# Patient Record
Sex: Male | Born: 1975 | Marital: Married | Smoking: Never smoker
Health system: Southern US, Community
[De-identification: ages and names within clinical notes are randomized; demographics above are authoritative.]

## PROBLEM LIST (undated history)

## (undated) DIAGNOSIS — E7800 Pure hypercholesterolemia, unspecified: Secondary | ICD-10-CM

## (undated) DIAGNOSIS — G4730 Sleep apnea, unspecified: Secondary | ICD-10-CM

## (undated) DIAGNOSIS — J302 Other seasonal allergic rhinitis: Secondary | ICD-10-CM

## (undated) DIAGNOSIS — I10 Essential (primary) hypertension: Secondary | ICD-10-CM

## (undated) DIAGNOSIS — M62838 Other muscle spasm: Secondary | ICD-10-CM

## (undated) HISTORY — PX: OTHER SURGICAL HISTORY: SHX169

## (undated) HISTORY — DX: Other muscle spasm: M62.838

## (undated) HISTORY — DX: Other seasonal allergic rhinitis: J30.2

## (undated) HISTORY — DX: Pure hypercholesterolemia, unspecified: E78.00

## (undated) HISTORY — DX: Sleep apnea, unspecified: G47.30

## (undated) HISTORY — DX: Essential (primary) hypertension: I10

---

## 2018-09-23 DIAGNOSIS — E7800 Pure hypercholesterolemia, unspecified: Secondary | ICD-10-CM | POA: Diagnosis not present

## 2018-09-23 DIAGNOSIS — E291 Testicular hypofunction: Secondary | ICD-10-CM | POA: Diagnosis not present

## 2018-09-23 DIAGNOSIS — I10 Essential (primary) hypertension: Secondary | ICD-10-CM | POA: Diagnosis not present

## 2018-09-28 DIAGNOSIS — G4730 Sleep apnea, unspecified: Secondary | ICD-10-CM | POA: Diagnosis not present

## 2018-09-28 DIAGNOSIS — I10 Essential (primary) hypertension: Secondary | ICD-10-CM | POA: Diagnosis not present

## 2018-09-28 DIAGNOSIS — E7800 Pure hypercholesterolemia, unspecified: Secondary | ICD-10-CM | POA: Diagnosis not present

## 2018-09-28 DIAGNOSIS — Z0000 Encounter for general adult medical examination without abnormal findings: Secondary | ICD-10-CM | POA: Diagnosis not present

## 2019-02-16 ENCOUNTER — Emergency Department (HOSPITAL_COMMUNITY)
Admission: EM | Admit: 2019-02-16 | Discharge: 2019-02-16 | Disposition: A | Discharge status: Home / Self Care | Payer: 59 | Attending: Emergency Medicine | Admitting: Emergency Medicine

## 2019-02-16 ENCOUNTER — Encounter: Payer: Self-pay | Admitting: Family Medicine

## 2019-02-16 ENCOUNTER — Other Ambulatory Visit: Payer: Self-pay

## 2019-02-16 ENCOUNTER — Ambulatory Visit: Payer: 59 | Admitting: Family Medicine

## 2019-02-16 ENCOUNTER — Encounter (HOSPITAL_COMMUNITY): Payer: Self-pay | Admitting: Emergency Medicine

## 2019-02-16 VITALS — Wt 280.0 lb

## 2019-02-16 DIAGNOSIS — Z0100 Encounter for examination of eyes and vision without abnormal findings: Secondary | ICD-10-CM | POA: Diagnosis not present

## 2019-02-16 DIAGNOSIS — Y929 Unspecified place or not applicable: Secondary | ICD-10-CM | POA: Diagnosis not present

## 2019-02-16 DIAGNOSIS — Y999 Unspecified external cause status: Secondary | ICD-10-CM | POA: Insufficient documentation

## 2019-02-16 DIAGNOSIS — W274XXA Contact with kitchen utensil, initial encounter: Secondary | ICD-10-CM | POA: Diagnosis not present

## 2019-02-16 DIAGNOSIS — Y93G1 Activity, food preparation and clean up: Secondary | ICD-10-CM | POA: Diagnosis not present

## 2019-02-16 DIAGNOSIS — S61012A Laceration without foreign body of left thumb without damage to nail, initial encounter: Secondary | ICD-10-CM | POA: Diagnosis not present

## 2019-02-16 MED ORDER — BUPIVACAINE HCL (PF) 0.5 % IJ SOLN
10.0000 mL | Freq: Once | INTRAMUSCULAR | Status: AC
  Administered 2019-02-16: 10 mL
  Filled 2019-02-16: qty 30

## 2019-02-16 NOTE — ED Notes (Signed)
An After Visit Summary was printed and given to the patient. Discharge instructions given and no further questions at this time.  

## 2019-02-16 NOTE — ED Notes (Signed)
Bupivaine at bedside

## 2019-02-16 NOTE — Patient Instructions (Signed)
Health Maintenance Due  Topic Date Due  . HIV Screening  11/30/1992  . TETANUS/TDAP  11/30/1996  . INFLUENZA VACCINE  11/07/2018   No flowsheet data found.

## 2019-02-16 NOTE — ED Provider Notes (Signed)
[facility] DEPT Provider Note   CSN: [id number] Arrival date & time: 02/16/19  1750     History   Chief Complaint Chief Complaint  Patient presents with  . Laceration    HPI Shawn Mckenzie is a 43 y.o. male.     HPI   Shawn Mckenzie is a 43 y.o. male, patient with no pertinent past medical history, presenting to the ED with laceration to left thumb that occurred shortly prior to arrival.  Patient was using a mandolin slicer.  Tetanus up-to-date. Denies numbness, weakness, other injuries.  History reviewed. No pertinent past medical history.  There are no active problems to display for this patient.   History reviewed. No pertinent surgical history.      Home Medications    Prior to Admission medications   Not on File    Family History No family history on file.  Social History Social History   Tobacco Use  . Smoking status: Never Smoker  . Smokeless tobacco: Never Used  Substance Use Topics  . Alcohol use: Not on file  . Drug use: Not on file     Allergies   Penicillins   Review of Systems Review of Systems  Skin: Positive for wound.  Neurological: Negative for weakness and numbness.     Physical Exam Updated Vital Signs BP 135/61 (BP Location: Right Arm)   Pulse 81   Temp 98.5 F (36.9 C) (Oral)   Resp 16   SpO2 99%   Physical Exam Vitals signs and nursing note reviewed.  Constitutional:      General: He is not in acute distress.    Appearance: He is well-developed. He is not diaphoretic.  HENT:     Head: Normocephalic and atraumatic.  Eyes:     Conjunctiva/sclera: Conjunctivae normal.  Neck:     Musculoskeletal: Neck supple.  Cardiovascular:     Rate and Rhythm: Normal rate and regular rhythm.  Pulmonary:     Effort: Pulmonary effort is normal.  Musculoskeletal:     Comments: 1.5 cm laceration/avulsion to left radial thumb with brisk bleeding noted.  Nail appears to be intact.  Full range of  motion in each of the joints of the thumb.  Skin:    General: Skin is warm and dry.     Coloration: Skin is not pale.  Neurological:     Mental Status: He is alert.     Comments: Sensation to light touch grossly intact in the left thumb.   Flexion and extension against resistance intact.  Psychiatric:        Behavior: Behavior normal.        ED Treatments / Results  Labs (all labs ordered are listed, but only abnormal results are displayed) Labs Reviewed - No data to display  EKG None  Radiology No results found.  Procedures .Nerve Block  Date/Time: 02/16/2019 7:09 PM Performed by: Lorayne Bender, PA-C Authorized by: Lorayne Bender, PA-C   Consent:    Consent obtained:  Verbal   Consent given by:  Patient   Risks discussed:  Swelling, unsuccessful block, pain, intravenous injection and bleeding Indications:    Indications:  Procedural anesthesia Location:    Body area:  Upper extremity   Upper extremity nerve blocked: Digital, left thumb.   Laterality:  Left Pre-procedure details:    Skin preparation:  Alcohol Procedure details (see MAR for exact dosages):    Block needle gauge:  25 G   Anesthetic injected:  Bupivacaine 0.5% w/o epi   Injection procedure:  Anatomic landmarks identified, anatomic landmarks palpated, incremental injection, introduced needle and negative aspiration for blood Post-procedure details:    Outcome:  Anesthesia achieved (Partial anesthesia achieved; direct infiltration was necessary)   Patient tolerance of procedure:  Tolerated well, no immediate complications .[name] KitchenLaceration Repair  Date/Time: 02/16/2019 8:00 PM Performed by: Lorayne Bender, PA-C Authorized by: Lorayne Bender, PA-C   Consent:    Consent obtained:  Verbal   Consent given by:  Patient   Risks discussed:  Infection, need for additional repair, nerve damage, pain, poor cosmetic result and poor wound healing Laceration details:    Location:  Finger   Finger location:  L thumb    Length (cm):  1.5 Repair type:    Repair type:  Simple Pre-procedure details:    Preparation:  Patient was prepped and draped in usual sterile fashion Treatment:    Area cleansed with:  Betadine   Amount of cleaning:  Standard   Irrigation solution:  Tap water   Irrigation method:  Tap Skin repair:    Repair method:  Sutures   Suture size:  4-0   Wound skin closure material used: Vicryl.   Suture technique:  Simple interrupted   Number of sutures:  4 Approximation:    Approximation:  Close Post-procedure details:    Dressing:  Non-adherent dressing and sterile dressing   Patient tolerance of procedure:  Tolerated well, no immediate complications   (including critical care time)  Medications Ordered in ED Medications  bupivacaine (MARCAINE) 0.5 % injection 10 mL (10 mLs Infiltration Given 02/16/19 1855)     Initial Impression / Assessment and Plan / ED Course  I have reviewed the triage vital signs and the nursing notes.  Pertinent labs & imaging results that were available during my care of the patient were reviewed by me and considered in my medical decision making (see chart for details).        Patient presents with lacerations/avulsion to the left thumb.  Distance between wound edges was close enough that I suspected laceration repair would be successful. As is the case many times with these types of wounds, complete anesthesia was difficult.  Ultimately, I was able to perform the laceration repair without immediate complication. The patient was given instructions for home care as well as return precautions. Patient voices understanding of these instructions, accepts the plan, and is comfortable with discharge.  Findings and plan of care discussed with Adrian Prows, MD. Dr. Vanita Panda personally evaluated and examined this patient.  Final Clinical Impressions(s) / ED Diagnoses   Final diagnoses:  Laceration of left thumb without foreign body without damage to  nail, initial encounter    ED Discharge Orders    None       Layla Maw 02/16/19 2208    Carmin Muskrat, MD 02/17/19 [id number]

## 2019-02-16 NOTE — Discharge Instructions (Signed)
°  Wound Care - Laceration You may remove the bandage after 24 hours. Clean the wound and surrounding area gently with tap water and mild soap. Rinse well and blot dry. Do not scrub the wound, as this may cause the wound edges to come apart. You may shower, but avoid submerging the wound, such as with a bath or swimming. Clean the wound daily to prevent infection. Do not use cleaners such as hydrogen peroxide or alcohol.   Scar reduction: Application of a topical antibiotic ointment, such as Neosporin, after the wound has begun to close and heal well can decrease scab formation and reduce scarring. After the wound has healed and wound closures have been removed, application of ointments such as Aquaphor can also reduce scar formation.  The key to scar reduction is keeping the skin well hydrated and supple. Drinking plenty of water throughout the day (At least eight 8oz glasses of water a day) is essential to staying well hydrated.  Sun exposure: Keep the wound out of the sun. After the wound has healed, continue to protect it from the sun by wearing protective clothing or applying sunscreen.  Pain: You may use Tylenol, naproxen, or ibuprofen for pain.  Suture/staple removal: Sutures should dissolve and fall out on their own, however, if they are still in place after about 12 days, they may be manually removed.  Return to the ED sooner should the wound edges come apart or signs of infection arise, such as spreading redness, puffiness/swelling, pus draining from the wound, severe increase in pain, fever over 100.58F, or any other major issues.  For prescription assistance, may try using prescription discount sites or apps, such as goodrx.com

## 2019-02-16 NOTE — Progress Notes (Signed)
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 Patient had laceration of left thumb sparing the nail while cooking this evening. His wife is our clinical lead and before ER trip I offered to evaluate to see if I could suture for patient. No obvious foreign body. Irrigated under running water as tolerated.    He had held pressure for over 15 minutes before removing bandage- significant bleeding occurred when removed and visible pulsations noted. We attempted to hold pressure for another 5 minutes with same result.   I was honest with patient that I did not have recent experience with digital nerve block nor with ligating vessels- I thought he would be best served in ER  I called to [facility] ER Dr. Tomi Bamberger stated patient could be seen in fast track rather quickly and does not anticipate prolonged ER wait. He has a work shift tomorrow so hoping to get him in and out of ER quickly. Happy to call again to help patient if needed.   No charge visit.  Garret Reddish

## 2019-02-16 NOTE — ED Triage Notes (Signed)
Patient repots cutting left thumb with slicing tool. Approximately one inch laceration. Actively bleeding in triage. Gauze applied.

## 2019-02-16 NOTE — ED Notes (Signed)
EDP at bedside  

## 2019-02-21 ENCOUNTER — Telehealth (HOSPITAL_COMMUNITY): Payer: Self-pay | Admitting: Emergency Medicine

## 2019-02-21 NOTE — Telephone Encounter (Signed)
02/21/19 9:22 AM Called patient to check on his wound. Pain has been improving and is minimal at this point.  He states wound appears to be clean and healing well without swelling, color change, or discharge.  We reviewed home care and return precautions.  Patient voiced understanding.

## 2019-03-11 ENCOUNTER — Ambulatory Visit: Payer: 59 | Admitting: Podiatry

## 2019-03-11 ENCOUNTER — Other Ambulatory Visit: Payer: Self-pay

## 2019-03-11 ENCOUNTER — Ambulatory Visit (INDEPENDENT_AMBULATORY_CARE_PROVIDER_SITE_OTHER): Payer: 59

## 2019-03-11 ENCOUNTER — Other Ambulatory Visit: Payer: Self-pay | Admitting: Podiatry

## 2019-03-11 ENCOUNTER — Encounter: Payer: Self-pay | Admitting: Podiatry

## 2019-03-11 VITALS — BP 133/80 | HR 75 | Resp 16

## 2019-03-11 DIAGNOSIS — M722 Plantar fascial fibromatosis: Secondary | ICD-10-CM

## 2019-03-11 DIAGNOSIS — M79671 Pain in right foot: Secondary | ICD-10-CM | POA: Diagnosis not present

## 2019-03-11 DIAGNOSIS — M2142 Flat foot [pes planus] (acquired), left foot: Secondary | ICD-10-CM

## 2019-03-11 DIAGNOSIS — M2141 Flat foot [pes planus] (acquired), right foot: Secondary | ICD-10-CM | POA: Diagnosis not present

## 2019-03-11 DIAGNOSIS — E785 Hyperlipidemia, unspecified: Secondary | ICD-10-CM | POA: Insufficient documentation

## 2019-03-11 DIAGNOSIS — I10 Essential (primary) hypertension: Secondary | ICD-10-CM | POA: Insufficient documentation

## 2019-03-11 MED ORDER — DICLOFENAC SODIUM 75 MG PO TBEC: 75.0000 mg | DELAYED_RELEASE_TABLET | Freq: Two times a day (BID) | ORAL | 2 refills | Status: DC

## 2019-03-11 NOTE — Progress Notes (Signed)
Subjective:   Patient ID: Shawn Mckenzie, male   DOB: 43 y.o.   MRN: [id number]   HPI Patient presents stating he has had approximate 33-month history of pain in the plantar aspect of the right heel.  Patient states that he has increased his walking over that period of time which seems to contribute to the problem and he has flatfeet and moderate obesity.  Has been taking anti-inflammatories and shoe gear modifications which have made mild improvement and patient does not smoke likes to be active   Review of Systems  All other systems reviewed and are negative.       Objective:  Physical Exam Vitals signs and nursing note reviewed.  Constitutional:      Appearance: He is well-developed.  Pulmonary:     Effort: Pulmonary effort is normal.  Musculoskeletal: Normal range of motion.  Skin:    General: Skin is warm.  Neurological:     Mental Status: He is alert.     Neurovascular status intact muscle strength found to be adequate range of motion within normal limits with patient noted to have exquisite discomfort plantar aspect right heel at the insertional point tendon into the calcaneus with inflammation fluid around the medial band.  Patient is found to have good digital perfusion and is well oriented x3 with no current equinus condition     Assessment:  Acute plantar fasciitis right with inflammation fluid of the medial band     Plan:  H&P x-ray reviewed sterile prep done and injected the plantar fascial 3 mg Kenalog 5 mg Xylocaine and applied fascial brace to lift up the arch.  Gave instructions for physical therapy shoe gear modifications and placed on diclofenac 75 mg twice daily  X-rays indicate small spur with moderate depression of the arch no indication of stress fracture

## 2019-03-11 NOTE — Progress Notes (Signed)
   Subjective:    Patient ID: Shawn Mckenzie, male    DOB: 1976-03-02, 43 y.o.   MRN: [id number]  HPI    Review of Systems  All other systems reviewed and are negative.      Objective:   Physical Exam        Assessment & Plan:

## 2019-03-11 NOTE — Patient Instructions (Signed)

## 2019-03-25 ENCOUNTER — Ambulatory Visit: Payer: 59 | Admitting: Podiatry

## 2019-03-25 ENCOUNTER — Other Ambulatory Visit: Payer: Self-pay

## 2019-03-25 ENCOUNTER — Encounter: Payer: Self-pay | Admitting: Podiatry

## 2019-03-25 DIAGNOSIS — G253 Myoclonus: Secondary | ICD-10-CM | POA: Diagnosis not present

## 2019-03-25 DIAGNOSIS — M722 Plantar fascial fibromatosis: Secondary | ICD-10-CM

## 2019-03-25 DIAGNOSIS — H6983 Other specified disorders of Eustachian tube, bilateral: Secondary | ICD-10-CM | POA: Diagnosis not present

## 2019-03-25 NOTE — Progress Notes (Signed)
Subjective:   Patient ID: Shawn Mckenzie, male   DOB: 43 y.o.   MRN: [id number]   HPI Patient states he is doing much better with his pain but does note that he has flatfeet and moderate obesity and he does walk quite a bit each day on cement floors   ROS      Objective:  Physical Exam  Neurovascular status intact with significant diminishment of discomfort plantar fascial right at the insertional point of the tendon into the calcaneus with again flatfoot deformity noted     Assessment:  Reviewed condition at great length with him and discussed the acute pain and how well it is doing but were not sure yet about chronic pain.  I did go ahead today recommended continued brace usage continued physical therapy to do at home along with shoe gear usage and we went ahead casted for functional orthotics with ped orthotist to try to lift up his arch and take stress off them along with heel lift bilateral.  Patient will be seen back when ready and see me as needed     Plan:  Above goes over plan

## 2019-04-22 ENCOUNTER — Other Ambulatory Visit: Payer: 59 | Admitting: Orthotics

## 2019-04-23 ENCOUNTER — Encounter: Payer: 59 | Admitting: Orthotics

## 2019-05-06 ENCOUNTER — Other Ambulatory Visit: Payer: Self-pay

## 2019-05-06 ENCOUNTER — Ambulatory Visit: Payer: 59 | Admitting: Orthotics

## 2019-05-06 DIAGNOSIS — H6983 Other specified disorders of Eustachian tube, bilateral: Secondary | ICD-10-CM | POA: Diagnosis not present

## 2019-05-06 DIAGNOSIS — G253 Myoclonus: Secondary | ICD-10-CM | POA: Diagnosis not present

## 2019-05-06 DIAGNOSIS — M722 Plantar fascial fibromatosis: Secondary | ICD-10-CM

## 2019-05-18 NOTE — Progress Notes (Signed)
Patient came in today to pick up custom made foot orthotics.  The goals were accomplished and the patient reported no dissatisfaction with said orthotics.  Patient was advised of breakin period and how to report any issues. 

## 2019-05-25 ENCOUNTER — Telehealth: Payer: Self-pay | Admitting: Podiatry

## 2019-05-25 DIAGNOSIS — M722 Plantar fascial fibromatosis: Secondary | ICD-10-CM

## 2019-05-25 NOTE — Telephone Encounter (Signed)
Pt called and is wanting to order a dress pair of orthotics for the 219.00 price not going thru insurance. I told him I would call when they come in and he would pay when he picks them up.

## 2019-07-02 ENCOUNTER — Telehealth: Payer: Self-pay | Admitting: Podiatry

## 2019-07-02 NOTE — Telephone Encounter (Signed)
Pt called to check to see if his other orthotic is in...  He called on 2.16.2021 and wanted a dress style ordered for him for the 219.00. I did not see it ordered can we order and rush for the pt please.

## 2019-10-06 DIAGNOSIS — E291 Testicular hypofunction: Secondary | ICD-10-CM | POA: Diagnosis not present

## 2019-10-06 DIAGNOSIS — R7989 Other specified abnormal findings of blood chemistry: Secondary | ICD-10-CM | POA: Diagnosis not present

## 2019-10-06 DIAGNOSIS — I10 Essential (primary) hypertension: Secondary | ICD-10-CM | POA: Diagnosis not present

## 2019-10-06 DIAGNOSIS — E7800 Pure hypercholesterolemia, unspecified: Secondary | ICD-10-CM | POA: Diagnosis not present

## 2019-10-06 DIAGNOSIS — G4730 Sleep apnea, unspecified: Secondary | ICD-10-CM | POA: Diagnosis not present

## 2019-10-06 DIAGNOSIS — Z0000 Encounter for general adult medical examination without abnormal findings: Secondary | ICD-10-CM | POA: Diagnosis not present

## 2019-10-07 DIAGNOSIS — R7989 Other specified abnormal findings of blood chemistry: Secondary | ICD-10-CM | POA: Diagnosis not present

## 2019-11-08 ENCOUNTER — Other Ambulatory Visit (HOSPITAL_COMMUNITY): Payer: Self-pay | Admitting: Internal Medicine

## 2019-11-09 ENCOUNTER — Encounter: Payer: Self-pay | Admitting: Neurology

## 2019-11-09 ENCOUNTER — Ambulatory Visit: Payer: 59 | Admitting: Neurology

## 2019-11-09 ENCOUNTER — Other Ambulatory Visit: Payer: Self-pay

## 2019-11-09 ENCOUNTER — Other Ambulatory Visit: Payer: Self-pay | Admitting: Neurology

## 2019-11-09 VITALS — BP 135/87 | HR 106 | Ht 67.5 in | Wt 341.0 lb

## 2019-11-09 DIAGNOSIS — M62838 Other muscle spasm: Secondary | ICD-10-CM | POA: Diagnosis not present

## 2019-11-09 MED ORDER — GABAPENTIN 100 MG PO CAPS: 300.0000 mg | ORAL_CAPSULE | Freq: Every day | ORAL | 5 refills | Status: DC

## 2019-11-09 NOTE — Progress Notes (Signed)
HISTORICAL  Shawn Mckenzie is a 44 year old male, seen in request by his primary care physician Dr. Delfina Redwood, Jori Moll for evaluation of frequent muscle cramps, initial evaluation was on November 09, 2019  I reviewed and summarized the referring note.  Past medical history Hyperlipidemia, has been on Lipitor 20 mg daily for few years Dyazide, for few years  He reported lifelong history of susceptible to muscle cramps, remember as teens, and frequent muscle cramps, mainly limited to calf region, occasionally trunk muscle spasm, lasting for few minutes, overcome by stretching muscles.  Since April 2021, he noticed gradual worsening muscle cramps, no longer limiting to calf, lower extremity, he described 1 episode, he had a sudden onset torso muscle spasm, it was so painful, he has to crawl out of his car, crawl into the room, lasting longer, very painful, difficult to move during the spells, he is having muscle spasms 3-4 times each week,  In addition, since early 8546, he noticed clicking sound in his ear, as if driving with windows down, he can ignore it during the day, but very noticeable when he is trying to go to sleep, sometimes it is difficulty for him to falling to sleep because the muscles twitching in his palate, and ears, he was seen by Dr. Benjamine Mola ENT physician few months ago, noticed uvular, soft palate muscle spasm, he was giving clonazepam 1 mg since February 2021, which has been helpful, he is in the process to wean to lower dose, and may take 0.5 mg every other day,  In between the spells, he denies sensory loss, denies muscle weakness, Laboratory evaluation from [facility] in 2021, TSH was normal 0.73, potassium 4, sodium 140, calcium 9.6, normal CBC hemoglobin of 14.4,  REVIEW OF SYSTEMS: Full 14 system review of systems performed and notable only for as above All other review of systems were negative.  ALLERGIES: Allergies  Allergen Reactions  . Penicillins     HOME  MEDICATIONS: Current Outpatient Medications  Medication Sig Dispense Refill  . atorvastatin (LIPITOR) 20 MG tablet     . clonazePAM (KLONOPIN) 0.5 MG tablet Take 0.5 mg by mouth at bedtime as needed.    [name] Kitchen levocetirizine (XYZAL) 5 MG tablet     . triamterene-hydrochlorothiazide (DYAZIDE) 37.5-25 MG capsule      No current facility-administered medications for this visit.    PAST MEDICAL HISTORY: Past Medical History:  Diagnosis Date  . Hypercholesteremia   . Hypertension   . Muscle spasm   . Seasonal allergies     PAST SURGICAL HISTORY: Past Surgical History:  Procedure Laterality Date  . No past surgery      FAMILY HISTORY: Family History  Problem Relation Age of Onset  . Hypertension Mother   . Hypercholesterolemia Mother   . Colon cancer Father     SOCIAL HISTORY: Social History   Socioeconomic History  . Marital status: Married    Spouse name: Not on file  . Number of children: Not on file  . Years of education: college  . Highest education level: Professional school degree (e.g., MD, DDS, DVM, JD)  Occupational History  . Occupation: physician  Tobacco Use  . Smoking status: Never Smoker  . Smokeless tobacco: Never Used  Vaping Use  . Vaping Use: Never used  Substance and Sexual Activity  . Alcohol use: Yes    Comment: 1 glass of scotch 2-3 times per week  . Drug use: Never  . Sexual activity: Not on file  Other Topics  Concern  . Not on file  Social History Narrative   Caffeine use: 12oz mug, 4-5 times per week.   Ambidextrous.   Lives at home with wife.   His first child is due 11/23/19 (son).   Social Determinants of Health   Financial Resource Strain:   . Difficulty of Paying Living Expenses:   Food Insecurity:   . Worried About Charity fundraiser in the Last Year:   . Arboriculturist in the Last Year:   Transportation Needs:   . Film/video editor (Medical):   [name] Kitchen Lack of Transportation (Non-Medical):   Physical Activity:   . Days of  Exercise per Week:   . Minutes of Exercise per Session:   Stress:   . Feeling of Stress :   Social Connections:   . Frequency of Communication with Friends and Family:   . Frequency of Social Gatherings with Friends and Family:   . Attends Religious Services:   . Active Member of Clubs or Organizations:   . Attends Archivist Meetings:   [name] Kitchen Marital Status:   Intimate Partner Violence:   . Fear of Current or Ex-Partner:   . Emotionally Abused:   [name] Kitchen Physically Abused:   . Sexually Abused:      PHYSICAL EXAM   Vitals:   11/09/19 1600  BP: 135/87  Pulse: (!) 106  Weight: (!) 341 lb (154.7 kg)  Height: 5' 7.5" (1.715 m)   Not recorded     Body mass index is 52.62 kg/m.  PHYSICAL EXAMNIATION:  Gen: NAD, conversant, well nourised, well groomed                     Cardiovascular: Regular rate rhythm, no peripheral edema, warm, nontender. Eyes: Conjunctivae clear without exudates or hemorrhage Neck: Supple, no carotid bruits. Pulmonary: Clear to auscultation bilaterally   NEUROLOGICAL EXAM:  MENTAL STATUS: Speech:    Speech is normal; fluent and spontaneous with normal comprehension.  Cognition:     Orientation to time, place and person     Normal recent and remote memory     Normal Attention span and concentration     Normal Language, naming, repeating,spontaneous speech     Fund of knowledge   CRANIAL NERVES: CN II: Visual fields are full to confrontation. Pupils are round equal and briskly reactive to light. CN III, IV, VI: extraocular movement are normal. No ptosis. CN V: Facial sensation is intact to light touch CN VII: Face is symmetric with normal eye closure  CN VIII: Hearing is normal to causal conversation. CN IX, X: Phonation is normal. CN XI: Head turning and shoulder shrug are intact  MOTOR: There is no pronator drift of out-stretched arms. Muscle bulk and tone are normal. Muscle strength is normal.  REFLEXES: Reflexes are 2+ and  symmetric at the biceps, triceps, knees, and ankles. Plantar responses are flexor.  SENSORY: Intact to light touch, pinprick and vibratory sensation are intact in fingers and toes.  COORDINATION: There is no trunk or limb dysmetria noted.  GAIT/STANCE: Posture is normal. Gait is steady with normal steps, base, arm swing, and turning. Heel and toe walking are normal. Tandem gait is normal.  Romberg is absent.  Electromyography, selected needle examination of left deltoid, extensor communis, cervical paraspinal muscles, left vastus lateralis, tibialis anterior showed no significant abnormality, normal insertional activity, normal morphology motor unit potential was normal recruitment patterns.  DIAGNOSTIC DATA (LABS, IMAGING, TESTING) - I reviewed patient records,  labs, notes, testing and imaging myself where available.   ASSESSMENT AND PLAN  KAICEN DESENA is a 44 y.o. male    Muscle spasm,  No focal neurological signs,  Laboratory evaluation to rule out underlying metabolic toxic etiology,  Gabapentin 100 mg up to 3 tablets at nighttime as needed    Marcial Pacas, M.D. Ph.D.  [facility] [address] [place], [place] [address] Ph: [phone] Fax: [phone]  CC:    Seward Carol, MD

## 2019-11-10 LAB — CK: Total CK: 201 U/L (ref 49–439)

## 2019-11-10 LAB — ANA W/REFLEX IF POSITIVE
Anti JO-1: <0.2 AI (ref 0.0–0.9)
Anti Nuclear Antibody (ANA): POSITIVE — AB
Centromere Ab Screen: <0.2 AI (ref 0.0–0.9)
Chromatin Ab SerPl-aCnc: <0.2 AI (ref 0.0–0.9)
ENA RNP Ab: 2 AI — ABNORMAL HIGH (ref 0.0–0.9)
ENA SM Ab Ser-aCnc: <0.2 AI (ref 0.0–0.9)
ENA SSA (RO) Ab: <0.2 AI (ref 0.0–0.9)
ENA SSB (LA) Ab: <0.2 AI (ref 0.0–0.9)
Scleroderma (Scl-70) (ENA) Antibody, IgG: <0.2 AI (ref 0.0–0.9)
dsDNA Ab: <1 IU/mL (ref 0–9)

## 2019-11-10 LAB — MAGNESIUM: Magnesium: 2.2 mg/dL (ref 1.6–2.3)

## 2019-11-10 LAB — FERRITIN: Ferritin: 426 ng/mL — ABNORMAL HIGH (ref 30–400)

## 2019-11-10 LAB — VITAMIN D 25 HYDROXY (VIT D DEFICIENCY, FRACTURES): Vit D, 25-Hydroxy: 11.9 ng/mL — ABNORMAL LOW (ref 30.0–100.0)

## 2019-11-10 LAB — SEDIMENTATION RATE: Sed Rate: 17 mm/hr — ABNORMAL HIGH (ref 0–15)

## 2019-11-10 LAB — C-REACTIVE PROTEIN: CRP: 3 mg/L (ref 0–10)

## 2019-11-10 LAB — VITAMIN B12: Vitamin B-12: 654 pg/mL (ref 232–1245)

## 2019-12-15 DIAGNOSIS — E221 Hyperprolactinemia: Secondary | ICD-10-CM | POA: Diagnosis not present

## 2019-12-15 DIAGNOSIS — G4730 Sleep apnea, unspecified: Secondary | ICD-10-CM | POA: Diagnosis not present

## 2019-12-15 DIAGNOSIS — R6882 Decreased libido: Secondary | ICD-10-CM | POA: Diagnosis not present

## 2019-12-15 DIAGNOSIS — E669 Obesity, unspecified: Secondary | ICD-10-CM | POA: Diagnosis not present

## 2019-12-15 DIAGNOSIS — E230 Hypopituitarism: Secondary | ICD-10-CM | POA: Diagnosis not present

## 2019-12-16 ENCOUNTER — Other Ambulatory Visit: Payer: Self-pay | Admitting: Radiation Oncology

## 2019-12-16 DIAGNOSIS — E230 Hypopituitarism: Secondary | ICD-10-CM

## 2019-12-16 DIAGNOSIS — E221 Hyperprolactinemia: Secondary | ICD-10-CM

## 2019-12-17 DIAGNOSIS — E221 Hyperprolactinemia: Secondary | ICD-10-CM | POA: Diagnosis not present

## 2019-12-17 DIAGNOSIS — E230 Hypopituitarism: Secondary | ICD-10-CM | POA: Diagnosis not present

## 2019-12-21 DIAGNOSIS — I10 Essential (primary) hypertension: Secondary | ICD-10-CM | POA: Diagnosis not present

## 2019-12-23 ENCOUNTER — Other Ambulatory Visit (HOSPITAL_COMMUNITY): Payer: Self-pay | Admitting: Internal Medicine

## 2020-01-07 ENCOUNTER — Other Ambulatory Visit: Payer: Self-pay | Admitting: Internal Medicine

## 2020-01-07 ENCOUNTER — Other Ambulatory Visit: Payer: Self-pay

## 2020-01-07 ENCOUNTER — Ambulatory Visit
Admission: RE | Admit: 2020-01-07 | Discharge: 2020-01-07 | Disposition: A | Discharge status: Home / Self Care | Payer: 59 | Source: Ambulatory Visit | Attending: Radiation Oncology | Admitting: Radiation Oncology

## 2020-01-07 DIAGNOSIS — E221 Hyperprolactinemia: Secondary | ICD-10-CM

## 2020-01-07 DIAGNOSIS — E230 Hypopituitarism: Secondary | ICD-10-CM

## 2020-01-12 ENCOUNTER — Other Ambulatory Visit (HOSPITAL_COMMUNITY): Payer: Self-pay | Admitting: Internal Medicine

## 2020-02-03 ENCOUNTER — Ambulatory Visit
Admission: RE | Admit: 2020-02-03 | Discharge: 2020-02-03 | Disposition: A | Discharge status: Home / Self Care | Payer: 59 | Source: Ambulatory Visit | Attending: Internal Medicine | Admitting: Internal Medicine

## 2020-02-03 ENCOUNTER — Other Ambulatory Visit: Payer: Self-pay

## 2020-02-03 DIAGNOSIS — E221 Hyperprolactinemia: Secondary | ICD-10-CM | POA: Diagnosis not present

## 2020-02-03 MED ORDER — GADOBENATE DIMEGLUMINE 529 MG/ML IV SOLN
10.0000 mL | Freq: Once | INTRAVENOUS | Status: AC | PRN
  Administered 2020-02-03: 10 mL via INTRAVENOUS

## 2020-02-16 DIAGNOSIS — E221 Hyperprolactinemia: Secondary | ICD-10-CM | POA: Diagnosis not present

## 2020-02-16 DIAGNOSIS — G4730 Sleep apnea, unspecified: Secondary | ICD-10-CM | POA: Diagnosis not present

## 2020-02-16 DIAGNOSIS — R6882 Decreased libido: Secondary | ICD-10-CM | POA: Diagnosis not present

## 2020-02-16 DIAGNOSIS — E230 Hypopituitarism: Secondary | ICD-10-CM | POA: Diagnosis not present

## 2020-02-21 ENCOUNTER — Other Ambulatory Visit (HOSPITAL_COMMUNITY): Payer: Self-pay

## 2020-02-22 DIAGNOSIS — E221 Hyperprolactinemia: Secondary | ICD-10-CM | POA: Diagnosis not present

## 2020-02-22 DIAGNOSIS — G4730 Sleep apnea, unspecified: Secondary | ICD-10-CM | POA: Diagnosis not present

## 2020-02-22 DIAGNOSIS — Z125 Encounter for screening for malignant neoplasm of prostate: Secondary | ICD-10-CM | POA: Diagnosis not present

## 2020-02-22 DIAGNOSIS — E230 Hypopituitarism: Secondary | ICD-10-CM | POA: Diagnosis not present

## 2020-03-01 ENCOUNTER — Encounter: Payer: Self-pay | Admitting: Pulmonary Disease

## 2020-03-01 ENCOUNTER — Other Ambulatory Visit: Payer: Self-pay

## 2020-03-01 ENCOUNTER — Ambulatory Visit: Payer: 59 | Admitting: Pulmonary Disease

## 2020-03-01 ENCOUNTER — Telehealth: Payer: Self-pay | Admitting: Pulmonary Disease

## 2020-03-01 VITALS — BP 120/100 | HR 111 | Temp 97.8°F | Ht 67.5 in | Wt 338.0 lb

## 2020-03-01 DIAGNOSIS — G4733 Obstructive sleep apnea (adult) (pediatric): Secondary | ICD-10-CM

## 2020-03-01 NOTE — Patient Instructions (Signed)
History of obstructive sleep apnea with recurrence of symptoms  Moderate probability of significant obstructive sleep apnea  We will schedule you for a home sleep study Update your results as soon as reviewed  Contact DME company for CPAP set up  Follow-up tentatively 3 to 4 weeks following initiation of treatment with CPAP-process usually takes about 3 months  Call with significant concerns

## 2020-03-01 NOTE — Telephone Encounter (Signed)
Called and left vm for medical records at [phone] to get fax # so we can fax release of information form to obtain records from the office of Dr. Barry Dienes.  Will await return call.

## 2020-03-01 NOTE — Progress Notes (Signed)
Shawn Mckenzie    [id number]    1975/12/24  Primary Care Physician:Polite, Jori Moll, MD  Referring Physician: Seward Carol, MD [address] [place],  [place] [address]  Chief complaint:   History of sleep apnea Nonrestorative sleep, multiple awakenings  HPI:  Obstructive sleep apnea was diagnosed about 2014 Initiated CPAP therapy, CPAP was used until about 2018 Managed to lose a lot of weight.  Recurrence of symptoms of multiple awakenings, headaches, constantly tired  No significant sleepiness during the day  No significant dryness of the mouth in the mornings No excessive sweating  About 40 pound weight gain Underlying history of hypertension, hypercholesterolemia  Dad has OSA, mom has snoring  Never smoker  Usual bedtime about 9 30-10 30 15  to 20 minutes to sleep onset 3-4 awakenings Final wake up time about 5:30 AM  Occupation: Physician  Outpatient Encounter Medications as of 03/01/2020  Medication Sig  . atorvastatin (LIPITOR) 20 MG tablet   . gabapentin (NEURONTIN) 100 MG capsule Take 3 capsules (300 mg total) by mouth at bedtime.  [name] Kitchen levocetirizine (XYZAL) 5 MG tablet   . chlorhexidine (PERIDEX) 0.12 % solution 15 mLs 2 (two) times daily.  . diclofenac (VOLTAREN) 75 MG EC tablet Take 75 mg by mouth 2 (two) times daily.  [name] Kitchen losartan (COZAAR) 50 MG tablet Take 50 mg by mouth daily.  . [DISCONTINUED] clonazePAM (KLONOPIN) 0.5 MG tablet Take 0.5 mg by mouth at bedtime as needed. (Patient not taking: Reported on 03/01/2020)  . [DISCONTINUED] triamterene-hydrochlorothiazide (DYAZIDE) 37.5-25 MG capsule  (Patient not taking: Reported on 03/01/2020)   No facility-administered encounter medications on file as of 03/01/2020.    Allergies as of 03/01/2020 - Review Complete 03/01/2020  Allergen Reaction Noted  . Penicillins  02/16/2019    Past Medical History:  Diagnosis Date  . Hypercholesteremia   . Hypertension   . Muscle spasm   .  Seasonal allergies   . Sleep apnea     Past Surgical History:  Procedure Laterality Date  . No past surgery      Family History  Problem Relation Age of Onset  . Hypertension Mother   . Hypercholesterolemia Mother   . Colon cancer Father     Social History   Socioeconomic History  . Marital status: Married    Spouse name: Not on file  . Number of children: Not on file  . Years of education: college  . Highest education level: Professional school degree (e.g., MD, DDS, DVM, JD)  Occupational History  . Occupation: physician  Tobacco Use  . Smoking status: Never Smoker  . Smokeless tobacco: Never Used  Vaping Use  . Vaping Use: Never used  Substance and Sexual Activity  . Alcohol use: Yes    Comment: 1 glass of scotch 2-3 times per week  . Drug use: Never  . Sexual activity: Not on file  Other Topics Concern  . Not on file  Social History Narrative   Caffeine use: 12oz mug, 4-5 times per week.   Ambidextrous.   Lives at home with wife.   His first child is due 11/23/19 (son).   Social Determinants of Health   Financial Resource Strain:   . Difficulty of Paying Living Expenses: Not on file  Food Insecurity:   . Worried About Charity fundraiser in the Last Year: Not on file  . Ran Out of Food in the Last Year: Not on file  Transportation  Needs:   . Lack of Transportation (Medical): Not on file  . Lack of Transportation (Non-Medical): Not on file  Physical Activity:   . Days of Exercise per Week: Not on file  . Minutes of Exercise per Session: Not on file  Stress:   . Feeling of Stress : Not on file  Social Connections:   . Frequency of Communication with Friends and Family: Not on file  . Frequency of Social Gatherings with Friends and Family: Not on file  . Attends Religious Services: Not on file  . Active Member of Clubs or Organizations: Not on file  . Attends Archivist Meetings: Not on file  . Marital Status: Not on file  Intimate Partner  Violence:   . Fear of Current or Ex-Partner: Not on file  . Emotionally Abused: Not on file  . Physically Abused: Not on file  . Sexually Abused: Not on file    Review of Systems  Respiratory: Positive for apnea.   Psychiatric/Behavioral: Positive for sleep disturbance.  All other systems reviewed and are negative.   Vitals:   03/01/20 0904  BP: (!) 120/100  Pulse: (!) 111  Temp: 97.8 F (36.6 C)  SpO2: 97%     Physical Exam Constitutional:      Appearance: He is obese.  HENT:     Nose: No congestion.     Mouth/Throat:     Mouth: Mucous membranes are moist.     Comments: Mallampati 3, crowded oropharynx Eyes:     General:        Right eye: No discharge.        Left eye: No discharge.  Neck:     Comments: Neck size 19 Cardiovascular:     Rate and Rhythm: Normal rate and regular rhythm.     Heart sounds: No murmur heard.  No friction rub.  Pulmonary:     Effort: Pulmonary effort is normal. No respiratory distress.     Breath sounds: Normal breath sounds. No stridor. No wheezing or rhonchi.  Neurological:     Mental Status: He is alert.  Psychiatric:        Mood and Affect: Mood normal.   Epworth of 6   Data Reviewed: Previous study not available to be reviewed  Assessment:  History of obstructive sleep apnea  Nonrestorative sleep  Obesity  Pathophysiology of sleep disordered breathing discussed, treatment options discussed  Plan/Recommendations: Schedule patient for home sleep study  We will attempt to obtain results from previous study  Patient aware of risks of not treating sleep disordered breathing  Encourage weight loss efforts  Follow-up in 3 months   Sherrilyn Rist MD  Pulmonary and Critical Care 03/01/2020, 9:10 AM  CC: Seward Carol, MD

## 2020-03-06 NOTE — Telephone Encounter (Signed)
Fax sent.  Nothing further needed.

## 2020-03-10 ENCOUNTER — Other Ambulatory Visit (HOSPITAL_COMMUNITY): Payer: Self-pay | Admitting: Internal Medicine

## 2020-03-24 ENCOUNTER — Ambulatory Visit: Payer: 59

## 2020-03-24 ENCOUNTER — Other Ambulatory Visit: Payer: Self-pay

## 2020-03-24 DIAGNOSIS — G4733 Obstructive sleep apnea (adult) (pediatric): Secondary | ICD-10-CM

## 2020-03-28 ENCOUNTER — Telehealth: Payer: Self-pay | Admitting: Pulmonary Disease

## 2020-03-28 DIAGNOSIS — G4733 Obstructive sleep apnea (adult) (pediatric): Secondary | ICD-10-CM | POA: Diagnosis not present

## 2020-03-28 NOTE — Telephone Encounter (Signed)
Spoke with the pt, notified of results/recs He is agreeable to CPAP start  Order sent and he is aware to call for 31-90 day f/u once he starts using machine

## 2020-03-28 NOTE — Telephone Encounter (Signed)
Call patient  Sleep study result  Date of study: 03/26/2020  Impression: Severe obstructive sleep apnea Moderate oxygen desaturations  Recommendation: DME referral  Recommend CPAP therapy for severe obstructive sleep apnea  Auto titrating CPAP with pressure settings of 5-20 will be appropriate  Encourage weight loss measures  Follow-up in the office 4 to 6 weeks following initiation of treatment  

## 2020-03-28 NOTE — Telephone Encounter (Signed)
LMTCB for the pt and hold in triage to call again 03/29/20

## 2020-04-05 DIAGNOSIS — E221 Hyperprolactinemia: Secondary | ICD-10-CM | POA: Diagnosis not present

## 2020-04-19 ENCOUNTER — Other Ambulatory Visit (HOSPITAL_COMMUNITY): Payer: Self-pay | Admitting: Internal Medicine

## 2020-04-19 ENCOUNTER — Ambulatory Visit: Payer: 59 | Attending: Internal Medicine

## 2020-04-19 DIAGNOSIS — Z23 Encounter for immunization: Secondary | ICD-10-CM

## 2020-04-19 NOTE — Progress Notes (Signed)
   Covid-19 Vaccination Clinic  Name:  Shawn Mckenzie    MRN: [id number] DOB: 07/27/1975  04/19/2020  Mr. [name] was observed post Covid-19 immunization for 15 minutes without incident. He was provided with Vaccine Information Sheet and instruction to access the V-Safe system.   Mr. [name] was instructed to call 911 with any severe reactions post vaccine: [name] Kitchen Difficulty breathing  . Swelling of face and throat  . A fast heartbeat  . A bad rash all over body  . Dizziness and weakness   Immunizations Administered    Name Date Dose VIS Date Route   Pfizer COVID-19 Vaccine 04/19/2020 11:18 AM 0.3 mL 01/26/2020 Intramuscular   Manufacturer: [place]   Lot: [id number]   NDC: [address]-4

## 2020-05-17 DIAGNOSIS — E230 Hypopituitarism: Secondary | ICD-10-CM | POA: Diagnosis not present

## 2020-05-17 DIAGNOSIS — G4730 Sleep apnea, unspecified: Secondary | ICD-10-CM | POA: Diagnosis not present

## 2020-05-17 DIAGNOSIS — R6882 Decreased libido: Secondary | ICD-10-CM | POA: Diagnosis not present

## 2020-05-17 DIAGNOSIS — E221 Hyperprolactinemia: Secondary | ICD-10-CM | POA: Diagnosis not present

## 2020-06-05 DIAGNOSIS — G4733 Obstructive sleep apnea (adult) (pediatric): Secondary | ICD-10-CM | POA: Diagnosis not present

## 2020-06-06 ENCOUNTER — Other Ambulatory Visit (HOSPITAL_COMMUNITY): Payer: Self-pay | Admitting: Internal Medicine

## 2020-06-28 ENCOUNTER — Other Ambulatory Visit: Payer: Self-pay

## 2020-06-28 ENCOUNTER — Ambulatory Visit (INDEPENDENT_AMBULATORY_CARE_PROVIDER_SITE_OTHER): Payer: 59 | Admitting: Pulmonary Disease

## 2020-06-28 ENCOUNTER — Encounter: Payer: Self-pay | Admitting: Pulmonary Disease

## 2020-06-28 VITALS — BP 124/84 | HR 97 | Temp 97.0°F | Ht 67.0 in | Wt 346.0 lb

## 2020-06-28 DIAGNOSIS — Z9989 Dependence on other enabling machines and devices: Secondary | ICD-10-CM

## 2020-06-28 DIAGNOSIS — G4733 Obstructive sleep apnea (adult) (pediatric): Secondary | ICD-10-CM

## 2020-06-28 NOTE — Patient Instructions (Signed)
Severe obstructive sleep apnea -Well treated with CPAP  Continue CPAP on a regular basis  Continue weight loss efforts  Call with significant concerns  I will see you back in 6 months

## 2020-06-28 NOTE — Progress Notes (Signed)
Shawn Mckenzie    [id number]    October 17, 1975  Primary Care Physician:Polite, Jori Moll, MD  Referring Physician: Seward Carol, MD [address] [place],  [place] [address]  Chief complaint:   History of sleep apnea Nonrestorative sleep, multiple awakenings  HPI:  Obstructive sleep apnea was diagnosed about 2014 Initiated CPAP therapy, CPAP was used until about 2018 Managed to lose a lot of weight.  He did have a repeat sleep study showing severe obstructive sleep apnea Reinitiated CPAP therapy Feeling better Tolerating CPAP well Headache is better Not having multiple awakenings  No significant sleepiness during the day  No significant dryness of the mouth in the mornings No excessive sweating  About 40 pound weight gain Underlying history of hypertension, hypercholesterolemia  Dad has OSA, mom has snoring  Never smoker  Usual bedtime about 9 30-10 30 15  to 20 minutes to sleep onset 3-4 awakenings Final wake up time about 5:30 AM  Occupation: Physician  Outpatient Encounter Medications as of 06/28/2020  Medication Sig  . atorvastatin (LIPITOR) 20 MG tablet   . chlorhexidine (PERIDEX) 0.12 % solution 15 mLs 2 (two) times daily.  . diclofenac (VOLTAREN) 75 MG EC tablet Take 75 mg by mouth 2 (two) times daily.  [name] Kitchen gabapentin (NEURONTIN) 100 MG capsule Take 3 capsules (300 mg total) by mouth at bedtime.  [name] Kitchen levocetirizine (XYZAL) 5 MG tablet   . losartan (COZAAR) 50 MG tablet Take 50 mg by mouth daily.  . cabergoline (DOSTINEX) 0.5 MG tablet Take 0.25 mg by mouth 2 (two) times a week.   No facility-administered encounter medications on file as of 06/28/2020.    Allergies as of 06/28/2020 - Review Complete 06/28/2020  Allergen Reaction Noted  . Penicillins  02/16/2019    Past Medical History:  Diagnosis Date  . Hypercholesteremia   . Hypertension   . Muscle spasm   . Seasonal allergies   . Sleep apnea     Past Surgical History:   Procedure Laterality Date  . No past surgery      Family History  Problem Relation Age of Onset  . Hypertension Mother   . Hypercholesterolemia Mother   . Colon cancer Father     Social History   Socioeconomic History  . Marital status: Married    Spouse name: Not on file  . Number of children: Not on file  . Years of education: college  . Highest education level: Professional school degree (e.g., MD, DDS, DVM, JD)  Occupational History  . Occupation: physician  Tobacco Use  . Smoking status: Never Smoker  . Smokeless tobacco: Never Used  Vaping Use  . Vaping Use: Never used  Substance and Sexual Activity  . Alcohol use: Yes    Comment: 1 glass of scotch 2-3 times per week  . Drug use: Never  . Sexual activity: Not on file  Other Topics Concern  . Not on file  Social History Narrative   Caffeine use: 12oz mug, 4-5 times per week.   Ambidextrous.   Lives at home with wife.   His first child is due 11/23/19 (son).   Social Determinants of Health   Financial Resource Strain: Not on file  Food Insecurity: Not on file  Transportation Needs: Not on file  Physical Activity: Not on file  Stress: Not on file  Social Connections: Not on file  Intimate Partner Violence: Not on file    Review of Systems  Constitutional: Negative  for fatigue.  Respiratory: Positive for apnea.   Psychiatric/Behavioral: Positive for sleep disturbance.  All other systems reviewed and are negative.   Vitals:   06/28/20 0855  BP: 124/84  Pulse: 97  Temp: (!) 97 F (36.1 C)  SpO2: 100%     Physical Exam Constitutional:      Appearance: He is obese.  HENT:     Nose: No congestion.     Mouth/Throat:     Mouth: Mucous membranes are moist.     Comments: Mallampati 3, crowded oropharynx Eyes:     General:        Right eye: No discharge.        Left eye: No discharge.  Neck:     Comments: Neck size 19 Cardiovascular:     Rate and Rhythm: Normal rate and regular rhythm.      Heart sounds: No murmur heard. No friction rub.  Pulmonary:     Effort: Pulmonary effort is normal. No respiratory distress.     Breath sounds: Normal breath sounds. No stridor. No wheezing or rhonchi.  Musculoskeletal:     Cervical back: No rigidity or tenderness.  Neurological:     Mental Status: He is alert.  Psychiatric:        Mood and Affect: Mood normal.   Epworth of 6   Data Reviewed: Previous study not available to be reviewed  Recent study reviewed showing severe obstructive sleep apnea  Download from his machine shows excellent compliance has only had the machine for about 3 weeks now Residual AHI of 0.6  Assessment:  History of obstructive sleep apnea -Severe obstructive sleep apnea -Adequately managed with CPAP therapy  Obesity -Encourage weight loss efforts -Diet and exercise, started keto diet  Pathophysiology of sleep disordered breathing discussed, treatment options discussed  Plan/Recommendations: Encouraged to continue using CPAP on a regular basis Patient aware of risks of not treating sleep disordered breathing  Encourage weight loss efforts  I will see him in 6 months  I spent 30 minutes dedicated to the care of this patient on the date of this encounter to include previsit review of records, face-to-face time with the patient discussing conditions above, post visit ordering of testing, clinical documentation with electronic health record and communicated necessary findings to members of the patient's care team  Sherrilyn Rist MD [facility] 06/28/2020, 9:14 AM  CC: Seward Carol, MD

## 2020-07-03 DIAGNOSIS — G4733 Obstructive sleep apnea (adult) (pediatric): Secondary | ICD-10-CM | POA: Diagnosis not present

## 2020-07-18 DIAGNOSIS — G4733 Obstructive sleep apnea (adult) (pediatric): Secondary | ICD-10-CM | POA: Diagnosis not present

## 2020-08-03 DIAGNOSIS — G4733 Obstructive sleep apnea (adult) (pediatric): Secondary | ICD-10-CM | POA: Diagnosis not present

## 2020-08-14 ENCOUNTER — Other Ambulatory Visit (HOSPITAL_COMMUNITY): Payer: Self-pay

## 2020-08-14 MED FILL — Losartan Potassium Tab 50 MG: ORAL | 90 days supply | Qty: 90 | Fill #0 | Status: AC

## 2020-08-14 MED FILL — Atorvastatin Calcium Tab 20 MG (Base Equivalent): ORAL | 90 days supply | Qty: 90 | Fill #0 | Status: AC

## 2020-08-16 ENCOUNTER — Other Ambulatory Visit (HOSPITAL_COMMUNITY): Payer: Self-pay

## 2020-08-29 DIAGNOSIS — G4733 Obstructive sleep apnea (adult) (pediatric): Secondary | ICD-10-CM | POA: Diagnosis not present

## 2020-09-02 DIAGNOSIS — G4733 Obstructive sleep apnea (adult) (pediatric): Secondary | ICD-10-CM | POA: Diagnosis not present

## 2020-10-02 ENCOUNTER — Encounter: Payer: Self-pay | Admitting: Genetic Counselor

## 2020-10-04 ENCOUNTER — Encounter: Payer: 59 | Admitting: Genetic Counselor

## 2020-10-04 ENCOUNTER — Other Ambulatory Visit: Payer: 59

## 2020-10-10 ENCOUNTER — Inpatient Hospital Stay: Payer: 59 | Attending: Genetic Counselor | Admitting: Genetic Counselor

## 2020-10-10 ENCOUNTER — Encounter: Payer: Self-pay | Admitting: Genetic Counselor

## 2020-10-10 ENCOUNTER — Inpatient Hospital Stay: Payer: 59

## 2020-10-10 ENCOUNTER — Other Ambulatory Visit: Payer: Self-pay

## 2020-10-10 DIAGNOSIS — Z8041 Family history of malignant neoplasm of ovary: Secondary | ICD-10-CM | POA: Diagnosis not present

## 2020-10-10 DIAGNOSIS — Z800 Family history of malignant neoplasm of digestive organs: Secondary | ICD-10-CM | POA: Diagnosis not present

## 2020-10-10 LAB — GENETIC SCREENING ORDER

## 2020-10-10 NOTE — Progress Notes (Addendum)
REFERRING PROVIDER: Seward Carol, MD [address] [place],  [place] [address]  PRIMARY PROVIDER:  Seward Carol, MD  PRIMARY REASON FOR VISIT:  1. Family history of ovarian cancer   2. Family history of colon cancer      HISTORY OF PRESENT ILLNESS:   Shawn Mckenzie, a 45 y.o. male, was seen for a [facility] cancer genetics consultation at the request of Dr. Delfina Redwood due to a family history of colon and ovarian cancer.  Shawn Mckenzie presents to clinic today to discuss the possibility of a hereditary predisposition to cancer, genetic testing, and to further clarify his future cancer risks, as well as potential cancer risks for family members.   Shawn Mckenzie is a 45 y.o. male with no personal history of cancer.  He had a colonoscopy several years ago that was normal.  CANCER HISTORY:  Oncology History   No history exists.     Past Medical History:  Diagnosis Date   Hypercholesteremia    Hypertension    Muscle spasm    Seasonal allergies    Sleep apnea     Past Surgical History:  Procedure Laterality Date   No past surgery      Social History   Socioeconomic History   Marital status: Married    Spouse name: Not on file   Number of children: Not on file   Years of education: college   Highest education level: Professional school degree (e.g., MD, DDS, DVM, JD)  Occupational History   Occupation: physician  Tobacco Use   Smoking status: Never   Smokeless tobacco: Never  Vaping Use   Vaping Use: Never used  Substance and Sexual Activity   Alcohol use: Yes    Comment: 1 glass of scotch 2-3 times per week   Drug use: Never   Sexual activity: Not on file  Other Topics Concern   Not on file  Social History Narrative   Caffeine use: 12oz mug, 4-5 times per week.   Ambidextrous.   Lives at home with wife.   His first child is due 11/23/19 (son).   Social Determinants of Health   Financial Resource Strain: Not on file  Food Insecurity: Not on file   Transportation Needs: Not on file  Physical Activity: Not on file  Stress: Not on file  Social Connections: Not on file     FAMILY HISTORY:  We obtained a detailed, 4-generation family history.  Significant diagnoses are listed below: Family History  Problem Relation Age of Onset   Hypertension Mother    Hypercholesterolemia Mother    Colon cancer Father 41   Lung cancer Maternal Grandfather 57   Colon cancer Paternal Grandmother 14   Ovarian cancer Paternal Aunt 54   Colon cancer Paternal Uncle 68    The patient has one son who is cancer free.  He has two full brothers and a paternal half sister who are cancer free.  Both parents are living.  The patient's father was diagnosed with colon cancer at 1.  He has a brother who had colon cancer and a sister who is currently battling ovarian cancer.  The paternal grandparents are deceased.  The grandfather and two of his brothers had colon cancer.  The grandmother died of a stroke.  The patient's mother is living.  She has two brothers and two sisters who are cancer free.  Her father died of lung cancer and her mother is living.  Her mother's sister died of bladder cancer  and another sister died of an unknown cancer.  Shawn Mckenzie is unaware of previous family history of genetic testing for hereditary cancer risks. Patient's maternal ancestors are of [place] and [place] descent, and paternal ancestors are of [place] American and Native American descent. There is no reported Ashkenazi Jewish ancestry. There is no known consanguinity.  GENETIC COUNSELING ASSESSMENT: Shawn Mckenzie is a 45 y.o. male with a family history of colon and ovarian cancer which is somewhat suggestive of a hereditary cancer syndrome, such as Lynch syndrome, and predisposition to cancer given the number of individuals with colon cancer and the combination of cancer. We, therefore, discussed and recommended the following at today's visit.   DISCUSSION: We discussed that 5  - 7% of colon cancer is hereditary, with most cases associated with Lynch syndrome.  There are other genes that can be associated with hereditary colon cancer syndromes.  These include APC, MUTYH and others.  We discussed that testing is beneficial for several reasons including knowing how to follow individuals and understand if other family members could be at risk for cancer and allow them to undergo genetic testing.   We reviewed the characteristics, features and inheritance patterns of hereditary cancer syndromes. We also discussed genetic testing, including the appropriate family members to test, the process of testing, insurance coverage and turn-around-time for results. We discussed the implications of a negative, positive, carrier and/or variant of uncertain significant result. We recommended Shawn Mckenzie pursue genetic testing for the CancerNext-Expanded+RNAinsight gene panel.   Based on Dr. Merilyn Baba family history of cancer, he meets medical criteria for genetic testing. Despite that he meets criteria, he may still have an out of pocket cost. We discussed that if his out of pocket cost for testing is over $100, the laboratory will call and confirm whether he wants to proceed with testing.  If the out of pocket cost of testing is less than $100 he will be billed by the genetic testing laboratory.   After his appointment, in order to estimate his chance of having a Lynch Syndrome mutation, we used statistical models (PREMM5) that consider his personal medical history, family history and ancestry.  Because each model is different, there can be a lot of variability in the risks they give.  Therefore, these numbers must be considered a rough range and not a precise risk of having a Lynch syndrome mutation.  These models estimate that he has approximately a 4.2% chance of having a mutation. Based on this assessment of her family and personal history, genetic testing is recommended.   PLAN: After considering  the risks, benefits, and limitations, Shawn Mckenzie provided informed consent to pursue genetic testing and the blood sample was sent to [facility] for analysis of the CancerNext-Expanded+RNAinsight. Results should be available within approximately 2-3 weeks' time, at which point they will be disclosed by telephone to Shawn Mckenzie, as will any additional recommendations warranted by these results. Shawn Mckenzie will receive a summary of his genetic counseling visit and a copy of his results once available. This information will also be available in Epic.   Based on Dr. Merilyn Baba family history, we recommended his father, who was diagnosed with colon cancer at age 67 or his aunt, who was diagnosed with ovarian cancer at age 7, have genetic counseling and testing. Shawn Mckenzie will let us know if we can be of any assistance in coordinating genetic counseling and/or testing for this family member.   Lastly, we encouraged Shawn Mckenzie to remain  in contact with cancer genetics annually so that we can continuously update the family history and inform him of any changes in cancer genetics and testing that may be of benefit for this family.   Dr. Merilyn Baba questions were answered to his satisfaction today. Our contact information was provided should additional questions or concerns arise. Thank you for the referral and allowing [place] to share in the care of your patient.   Jerime Arif P. Florene Glen, [place], [facility] Licensed, Insurance risk surveyor [email or web address] phone: [phone]  The patient was seen for a total of 25 minutes in face-to-face genetic counseling.  The patient was seen alone.  This patient was discussed with Drs. Magrinat, Lindi Adie and/or Burr Medico who agrees with the above.    _______________________________________________________________________ For Office Staff:  Number of people involved in session: 1 Was an Intern/ student involved with case: no

## 2020-10-11 ENCOUNTER — Other Ambulatory Visit (HOSPITAL_COMMUNITY): Payer: Self-pay

## 2020-10-11 MED FILL — Cabergoline Tab 0.5 MG: ORAL | 84 days supply | Qty: 12 | Fill #0 | Status: AC

## 2020-10-20 ENCOUNTER — Ambulatory Visit: Payer: Self-pay | Admitting: Genetic Counselor

## 2020-10-20 ENCOUNTER — Telehealth: Payer: Self-pay | Admitting: Genetic Counselor

## 2020-10-20 ENCOUNTER — Encounter: Payer: Self-pay | Admitting: Genetic Counselor

## 2020-10-20 DIAGNOSIS — Z1379 Encounter for other screening for genetic and chromosomal anomalies: Secondary | ICD-10-CM | POA: Insufficient documentation

## 2020-10-20 NOTE — Telephone Encounter (Signed)
Revealed negative genetic testing.  Discussed that we do not know why there is cancer in the family. It could be due to a different gene that we are not testing, or maybe our current technology may not be able to pick something up.  It will be important for him to keep in contact with genetics to keep up with whether additional testing may be needed.

## 2020-10-20 NOTE — Progress Notes (Signed)
HPI:  [name] was previously seen in the [facility] due to a family history of cancer and concerns regarding a hereditary predisposition to cancer. Please refer to our prior cancer genetics clinic note for more information regarding our discussion, assessment and recommendations, at the time. [name] recent genetic test results were disclosed to him, as were recommendations warranted by these results. These results and recommendations are discussed in more detail below.  CANCER HISTORY:  Oncology History   No history exists.    FAMILY HISTORY:  We obtained a detailed, 4-generation family history.  Significant diagnoses are listed below: Family History  Problem Relation Age of Onset   Hypertension Mother    Hypercholesterolemia Mother    Colon cancer Father 80   Lung cancer Maternal Grandfather 14   Colon cancer Paternal Grandmother 14   Ovarian cancer Paternal Aunt 101   Colon cancer Paternal Uncle 23    The patient has one son who is cancer free.  He has two full brothers and a paternal half sister who are cancer free.  Both parents are living.   The patient's father was diagnosed with colon cancer at 62.  He has a brother who had colon cancer and a sister who is currently battling ovarian cancer.  The paternal grandparents are deceased.  The grandfather and two of his brothers had colon cancer.  The grandmother died of a stroke.   The patient's mother is living.  She has two brothers and two sisters who are cancer free.  Her father died of lung cancer and her mother is living.  Her mother's sister died of bladder cancer and another sister died of an unknown cancer.   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 Patient's maternal ancestors are of [place] and [place] descent, and paternal ancestors are of [place] American and Native American descent. There is no reported Ashkenazi Jewish ancestry. There is  no known consanguinity.  GENETIC TEST RESULTS: Genetic testing reported out on October 20, 2020 through the CancerNext-Expanded+RNAinsight cancer panel found no pathogenic mutations. The CancerNext-Expanded gene panel offered by [facility] and includes sequencing and rearrangement analysis for the following 77 genes: AIP, ALK, APC*, ATM*, AXIN2, BAP1, BARD1, BLM, BMPR1A, BRCA1*, BRCA2*, BRIP1*, CDC73, CDH1*, CDK4, CDKN1B, CDKN2A, CHEK2*, CTNNA1, DICER1, FANCC, FH, FLCN, GALNT12, KIF1B, LZTR1, MAX, MEN1, MET, MLH1*, MSH2*, MSH3, MSH6*, MUTYH*, NBN, NF1*, NF2, NTHL1, PALB2*, PHOX2B, PMS2*, POT1, PRKAR1A, PTCH1, PTEN*, RAD51C*, RAD51D*, RB1, RECQL, RET, SDHA, SDHAF2, SDHB, SDHC, SDHD, SMAD4, SMARCA4, SMARCB1, SMARCE1, STK11, SUFU, TMEM127, TP53*, TSC1, TSC2, VHL and XRCC2 (sequencing and deletion/duplication); EGFR, EGLN1, HOXB13, KIT, MITF, PDGFRA, POLD1, and POLE (sequencing only); EPCAM and GREM1 (deletion/duplication only). DNA and RNA analyses performed for * genes. The test report has been scanned into EPIC and is located under the Molecular Pathology section of the Results Review tab.  A portion of the result report is included below for reference.     We discussed with [name] that because current genetic testing is not perfect, it is possible there may be a gene mutation in one of these genes that current testing cannot detect, but that chance is small.  We also discussed, that there could be another gene that has not yet been discovered, or that we have not yet tested, that is responsible for the cancer diagnoses in the family. It is also possible there is a hereditary cause for the cancer in the family  that [name] did not inherit and therefore was not identified in his testing.  Therefore, it is important to remain in touch with cancer genetics in the future so that we can continue to offer [name] the most up to date genetic testing.   ADDITIONAL GENETIC TESTING: We discussed with [name] that  his genetic testing was fairly extensive.  If there are genes identified to increase cancer risk that can be analyzed in the future, we would be happy to discuss and coordinate this testing at that time.    CANCER SCREENING RECOMMENDATIONS: [name] test result is considered negative (normal).  This means that we have not identified a hereditary cause for his family history of cancer at this time. Most cancers happen by chance and this negative test suggests that his cancer may fall into this category.    While reassuring, this does not definitively rule out a hereditary predisposition to cancer. It is still possible that there could be genetic mutations that are undetectable by current technology. There could be genetic mutations in genes that have not been tested or identified to increase cancer risk.  Therefore, it is recommended he continue to follow the cancer management and screening guidelines provided by his primary healthcare provider.   An individual's cancer risk and medical management are not determined by genetic test results alone. Overall cancer risk assessment incorporates additional factors, including personal medical history, family history, and any available genetic information that may result in a personalized plan for cancer prevention and surveillance  RECOMMENDATIONS FOR FAMILY MEMBERS:  Individuals in this family might be at some increased risk of developing cancer, over the general population risk, simply due to the family history of cancer.  We recommended women in this family have a yearly mammogram beginning at age 32, or 38 years younger than the earliest onset of cancer, an annual clinical breast exam, and perform monthly breast self-exams. Women in this family should also have a gynecological exam as recommended by their primary provider. All family members should be referred for colonoscopy starting at age 71.  It is also possible there is a hereditary cause for the cancer  in [name] family that he did not inherit and therefore was not identified in him.  Based on [name] family history, we recommended his father, who was diagnosed with colon cancer at age 28, have genetic counseling and testing. [name] will let us know if we can be of any assistance in coordinating genetic counseling and/or testing for this family member.   FOLLOW-UP: Lastly, we discussed with [name] that cancer genetics is a rapidly advancing field and it is possible that new genetic tests will be appropriate for him and/or his family members in the future. We encouraged him to remain in contact with cancer genetics on an annual basis so we can update his personal and family histories and let him know of advances in cancer genetics that may benefit this family.   Our contact number was provided. [name] questions were answered to his satisfaction, and he knows he is welcome to call us at anytime with additional questions or concerns.   Roma Kayser, [place], [facility] Licensed, Certified Genetic Counselor [email or web address]

## 2020-11-07 DIAGNOSIS — I10 Essential (primary) hypertension: Secondary | ICD-10-CM | POA: Diagnosis not present

## 2020-11-07 DIAGNOSIS — G4730 Sleep apnea, unspecified: Secondary | ICD-10-CM | POA: Diagnosis not present

## 2020-11-07 DIAGNOSIS — Z0000 Encounter for general adult medical examination without abnormal findings: Secondary | ICD-10-CM | POA: Diagnosis not present

## 2020-11-07 DIAGNOSIS — E230 Hypopituitarism: Secondary | ICD-10-CM | POA: Diagnosis not present

## 2020-11-07 DIAGNOSIS — E221 Hyperprolactinemia: Secondary | ICD-10-CM | POA: Diagnosis not present

## 2020-11-07 DIAGNOSIS — E7800 Pure hypercholesterolemia, unspecified: Secondary | ICD-10-CM | POA: Diagnosis not present

## 2020-11-15 DIAGNOSIS — E230 Hypopituitarism: Secondary | ICD-10-CM | POA: Diagnosis not present

## 2020-11-15 DIAGNOSIS — M654 Radial styloid tenosynovitis [de Quervain]: Secondary | ICD-10-CM | POA: Diagnosis not present

## 2020-11-15 DIAGNOSIS — E221 Hyperprolactinemia: Secondary | ICD-10-CM | POA: Diagnosis not present

## 2020-11-21 DIAGNOSIS — G4733 Obstructive sleep apnea (adult) (pediatric): Secondary | ICD-10-CM | POA: Diagnosis not present

## 2020-11-22 ENCOUNTER — Other Ambulatory Visit (HOSPITAL_COMMUNITY): Payer: Self-pay

## 2020-11-22 MED ORDER — ATORVASTATIN CALCIUM 20 MG PO TABS
20.0000 mg | ORAL_TABLET | Freq: Every day | ORAL | 3 refills | Status: DC
  Filled 2020-11-22 – 2020-11-30 (×2): qty 90, 90d supply, fill #0
  Filled 2021-03-27: qty 90, 90d supply, fill #1
  Filled 2021-07-21: qty 90, 90d supply, fill #2
  Filled 2021-10-21: qty 90, 90d supply, fill #3

## 2020-11-22 MED FILL — Losartan Potassium Tab 50 MG: ORAL | 90 days supply | Qty: 90 | Fill #1 | Status: CN

## 2020-11-24 ENCOUNTER — Other Ambulatory Visit (HOSPITAL_COMMUNITY): Payer: Self-pay

## 2020-11-30 ENCOUNTER — Other Ambulatory Visit (HOSPITAL_COMMUNITY): Payer: Self-pay

## 2020-11-30 MED ORDER — CARESTART COVID-19 HOME TEST VI KIT
PACK | 0 refills | Status: DC
  Filled 2020-11-30: qty 4, 4d supply, fill #0

## 2020-11-30 MED FILL — Losartan Potassium Tab 50 MG: ORAL | 90 days supply | Qty: 90 | Fill #1 | Status: AC

## 2020-12-18 ENCOUNTER — Other Ambulatory Visit: Payer: Self-pay | Admitting: Gastroenterology

## 2020-12-18 DIAGNOSIS — Z800 Family history of malignant neoplasm of digestive organs: Secondary | ICD-10-CM | POA: Diagnosis not present

## 2020-12-18 DIAGNOSIS — R1310 Dysphagia, unspecified: Secondary | ICD-10-CM | POA: Diagnosis not present

## 2020-12-26 ENCOUNTER — Other Ambulatory Visit: Payer: Self-pay | Admitting: Gastroenterology

## 2021-01-05 ENCOUNTER — Other Ambulatory Visit (HOSPITAL_COMMUNITY): Payer: Self-pay

## 2021-01-05 MED ORDER — CABERGOLINE 0.5 MG PO TABS
ORAL_TABLET | ORAL | 11 refills | Status: DC
  Filled 2021-01-05: qty 4, 28d supply, fill #0
  Filled 2021-01-06: qty 12, 84d supply, fill #0
  Filled 2021-01-06: qty 4, 14d supply, fill #0
  Filled 2021-04-03: qty 12, 84d supply, fill #1
  Filled 2021-07-03: qty 12, 84d supply, fill #2
  Filled 2021-09-25: qty 12, 84d supply, fill #3

## 2021-01-05 MED FILL — Cabergoline Tab 0.5 MG: ORAL | 56 days supply | Qty: 8 | Fill #1 | Status: CN

## 2021-01-06 ENCOUNTER — Other Ambulatory Visit (HOSPITAL_COMMUNITY): Payer: Self-pay

## 2021-01-06 MED ORDER — PEG 3350-KCL-NA BICARB-NACL 420 G PO SOLR
ORAL | 0 refills | Status: DC
  Filled 2021-01-06 – 2021-05-01 (×2): qty 4000, 1d supply, fill #0

## 2021-02-07 ENCOUNTER — Encounter: Payer: Self-pay | Admitting: Pulmonary Disease

## 2021-02-07 ENCOUNTER — Other Ambulatory Visit: Payer: Self-pay

## 2021-02-07 ENCOUNTER — Ambulatory Visit (INDEPENDENT_AMBULATORY_CARE_PROVIDER_SITE_OTHER): Payer: 59 | Admitting: Pulmonary Disease

## 2021-02-07 VITALS — BP 124/70 | HR 84 | Temp 98.4°F | Ht 67.0 in | Wt 344.0 lb

## 2021-02-07 DIAGNOSIS — G4733 Obstructive sleep apnea (adult) (pediatric): Secondary | ICD-10-CM

## 2021-02-07 DIAGNOSIS — Z9989 Dependence on other enabling machines and devices: Secondary | ICD-10-CM

## 2021-02-07 NOTE — Patient Instructions (Signed)
Obstructive sleep apnea -CPAP working well with no concerns  Continue CPAP on a nightly basis  I will see you in a year from now  Call with any significant concerns

## 2021-02-07 NOTE — Progress Notes (Signed)
Shawn Mckenzie    [id number]    1975-04-25  Primary Care Physician:Polite, Jori Moll, MD  Referring Physician: Seward Carol, MD [address] [place],   [id number]  Chief complaint:   History of sleep apnea  HPI:  Obstructive sleep apnea was diagnosed about 2014 Initiated CPAP therapy, CPAP was used until about 2018 Managed to lose a lot of weight. Has been using CPAP nightly  CPAP working well with no concerns Getting good number of hours of sleep Waking up feeling restored No significant number of awakenings  Doing well from a severe sleep apnea perspective  No significant sleepiness during the day  No significant dryness of the mouth in the mornings No excessive sweating  Underlying history of hypertension, hypercholesterolemia -Controlled  Dad has OSA, mom has snoring  Never smoker  Usual bedtime about 9 30-10 30 15  to 20 minutes to sleep onset 3-4 awakenings Final wake up time about 5:30 AM  Occupation: Physician  Outpatient Encounter Medications as of 06/28/2020  Medication Sig   atorvastatin (LIPITOR) 20 MG tablet    chlorhexidine (PERIDEX) 0.12 % solution 15 mLs 2 (two) times daily.   diclofenac (VOLTAREN) 75 MG EC tablet Take 75 mg by mouth 2 (two) times daily.   gabapentin (NEURONTIN) 100 MG capsule Take 3 capsules (300 mg total) by mouth at bedtime.   levocetirizine (XYZAL) 5 MG tablet    losartan (COZAAR) 50 MG tablet Take 50 mg by mouth daily.   cabergoline (DOSTINEX) 0.5 MG tablet Take 0.25 mg by mouth 2 (two) times a week.   No facility-administered encounter medications on file as of 06/28/2020.    Allergies as of 06/28/2020 - Review Complete 06/28/2020  Allergen Reaction Noted   Penicillins  02/16/2019    Past Medical History:  Diagnosis Date   Hypercholesteremia    Hypertension    Muscle spasm    Seasonal allergies    Sleep apnea     Past Surgical History:  Procedure Laterality Date   No past  surgery      Family History  Problem Relation Age of Onset   Hypertension Mother    Hypercholesterolemia Mother    Colon cancer Father     Social History   Socioeconomic History   Marital status: Married    Spouse name: Not on file   Number of children: Not on file   Years of education: college   Highest education level: Professional school degree (e.g., MD, DDS, DVM, JD)  Occupational History   Occupation: physician  Tobacco Use   Smoking status: Never Smoker   Smokeless tobacco: Never Used  Scientific laboratory technician Use: Never used  Substance and Sexual Activity   Alcohol use: Yes    Comment: 1 glass of scotch 2-3 times per week   Drug use: Never   Sexual activity: Not on file  Other Topics Concern   Not on file  Social History Narrative   Caffeine use: 12oz mug, 4-5 times per week.   Ambidextrous.   Lives at home with wife.   His first child is due 11/23/19 (son).   Social Determinants of Health   Financial Resource Strain: Not on file  Food Insecurity: Not on file  Transportation Needs: Not on file  Physical Activity: Not on file  Stress: Not on file  Social Connections: Not on file  Intimate Partner Violence: Not on file    Review of Systems  Constitutional:  Negative for fatigue.  Respiratory:  Positive for apnea.   Psychiatric/Behavioral:  Positive for sleep disturbance.   All other systems reviewed and are negative.  Vitals:   06/28/20 0855  BP: 124/84  Pulse: 97  Temp: (!) 97 F (36.1 C)  SpO2: 100%     Physical Exam Constitutional:      Appearance: He is obese.  HENT:     Nose: No congestion.  Neck:     Comments: Neck size 19 Cardiovascular:     Rate and Rhythm: Normal rate and regular rhythm.     Heart sounds: No murmur heard.   No friction rub.  Pulmonary:     Effort: Pulmonary effort is normal. No respiratory distress.     Breath sounds: Normal breath sounds. No stridor. No wheezing or rhonchi.  Musculoskeletal:     Cervical back:  No rigidity or tenderness.  Neurological:     Mental Status: He is alert.  Psychiatric:        Mood and Affect: Mood normal.    Data Reviewed: Previous study not available to be reviewed  Recent study reviewed showing severe obstructive sleep apnea  Excellent compliance with CPAP with residual AHI less than 1  Assessment:  History of obstructive sleep apnea -Severe obstructive sleep apnea adequately treated with CPAP therapy -Continues to benefit from CPAP therapy  Obesity -Encourage weight loss efforts -Encouraged to continue with diet and exercise   Plan/Recommendations: Encouraged to continue using CPAP on a regular basis  Continue weight loss efforts  Follow-up a year from now   Sherrilyn Rist MD [facility] 06/28/2020, 9:14 AM  CC: Seward Carol, MD

## 2021-02-13 DIAGNOSIS — G4733 Obstructive sleep apnea (adult) (pediatric): Secondary | ICD-10-CM | POA: Diagnosis not present

## 2021-03-13 ENCOUNTER — Encounter (HOSPITAL_COMMUNITY): Payer: Self-pay

## 2021-03-13 ENCOUNTER — Ambulatory Visit (HOSPITAL_COMMUNITY): Admit: 2021-03-13 | Payer: 59 | Admitting: Gastroenterology

## 2021-03-13 SURGERY — ESOPHAGOGASTRODUODENOSCOPY (EGD) WITH PROPOFOL
Anesthesia: Monitor Anesthesia Care

## 2021-03-27 ENCOUNTER — Other Ambulatory Visit (HOSPITAL_COMMUNITY): Payer: Self-pay

## 2021-04-03 ENCOUNTER — Other Ambulatory Visit (HOSPITAL_COMMUNITY): Payer: Self-pay

## 2021-04-30 ENCOUNTER — Encounter (HOSPITAL_COMMUNITY): Payer: Self-pay | Admitting: Gastroenterology

## 2021-05-01 ENCOUNTER — Other Ambulatory Visit (HOSPITAL_COMMUNITY): Payer: Self-pay

## 2021-05-01 ENCOUNTER — Other Ambulatory Visit: Payer: Self-pay | Admitting: Gastroenterology

## 2021-05-07 NOTE — Anesthesia Preprocedure Evaluation (Addendum)
Anesthesia Evaluation  Patient identified by MRN, date of birth, ID band Patient awake    Reviewed: Allergy & Precautions, NPO status , Patient's Chart, lab work & pertinent test results  Airway Mallampati: III  TM Distance: >3 FB Neck ROM: Full    Dental  (+) Teeth Intact, Dental Advisory Given   Pulmonary sleep apnea and Continuous Positive Airway Pressure Ventilation ,    Pulmonary exam normal breath sounds clear to auscultation       Cardiovascular hypertension, Pt. on medications Normal cardiovascular exam Rhythm:Regular Rate:Normal     Neuro/Psych negative neurological ROS  negative psych ROS   GI/Hepatic negative GI ROS, Neg liver ROS, dysphagia, family history of colon cancer   Endo/Other  Morbid obesity  Renal/GU negative Renal ROS     Musculoskeletal negative musculoskeletal ROS (+)   Abdominal   Peds  Hematology negative hematology ROS (+)   Anesthesia Other Findings Day of surgery medications reviewed with the patient.  Reproductive/Obstetrics                           Anesthesia Physical Anesthesia Plan  ASA: 3  Anesthesia Plan: MAC   Post-op Pain Management:    Induction: Intravenous  PONV Risk Score and Plan: 1 and Propofol infusion and Treatment may vary due to age or medical condition  Airway Management Planned: Natural Airway and Nasal Cannula  Additional Equipment:   Intra-op Plan:   Post-operative Plan:   Informed Consent: I have reviewed the patients History and Physical, chart, labs and discussed the procedure including the risks, benefits and alternatives for the proposed anesthesia with the patient or authorized representative who has indicated his/her understanding and acceptance.     Dental advisory given  Plan Discussed with: CRNA  Anesthesia Plan Comments:        Anesthesia Quick Evaluation

## 2021-05-08 ENCOUNTER — Ambulatory Visit (HOSPITAL_COMMUNITY): Payer: 59 | Admitting: Anesthesiology

## 2021-05-08 ENCOUNTER — Ambulatory Visit (HOSPITAL_COMMUNITY)
Admission: RE | Admit: 2021-05-08 | Discharge: 2021-05-08 | Disposition: A | Discharge status: Home / Self Care | Payer: 59 | Attending: Gastroenterology | Admitting: Gastroenterology

## 2021-05-08 ENCOUNTER — Encounter (HOSPITAL_COMMUNITY)
Admission: RE | Disposition: A | Discharge status: Home / Self Care | Payer: Self-pay | Source: Home / Self Care | Attending: Gastroenterology

## 2021-05-08 ENCOUNTER — Other Ambulatory Visit: Payer: Self-pay

## 2021-05-08 DIAGNOSIS — K2289 Other specified disease of esophagus: Secondary | ICD-10-CM | POA: Diagnosis not present

## 2021-05-08 DIAGNOSIS — K317 Polyp of stomach and duodenum: Secondary | ICD-10-CM | POA: Diagnosis not present

## 2021-05-08 DIAGNOSIS — K2950 Unspecified chronic gastritis without bleeding: Secondary | ICD-10-CM | POA: Insufficient documentation

## 2021-05-08 DIAGNOSIS — I10 Essential (primary) hypertension: Secondary | ICD-10-CM | POA: Insufficient documentation

## 2021-05-08 DIAGNOSIS — Z1211 Encounter for screening for malignant neoplasm of colon: Secondary | ICD-10-CM | POA: Diagnosis not present

## 2021-05-08 DIAGNOSIS — G4730 Sleep apnea, unspecified: Secondary | ICD-10-CM | POA: Diagnosis not present

## 2021-05-08 DIAGNOSIS — Z800 Family history of malignant neoplasm of digestive organs: Secondary | ICD-10-CM | POA: Diagnosis not present

## 2021-05-08 DIAGNOSIS — Z6841 Body Mass Index (BMI) 40.0 and over, adult: Secondary | ICD-10-CM | POA: Insufficient documentation

## 2021-05-08 DIAGNOSIS — D122 Benign neoplasm of ascending colon: Secondary | ICD-10-CM | POA: Insufficient documentation

## 2021-05-08 DIAGNOSIS — D120 Benign neoplasm of cecum: Secondary | ICD-10-CM | POA: Insufficient documentation

## 2021-05-08 DIAGNOSIS — K640 First degree hemorrhoids: Secondary | ICD-10-CM | POA: Diagnosis not present

## 2021-05-08 DIAGNOSIS — R1310 Dysphagia, unspecified: Secondary | ICD-10-CM | POA: Diagnosis not present

## 2021-05-08 DIAGNOSIS — K2900 Acute gastritis without bleeding: Secondary | ICD-10-CM | POA: Diagnosis not present

## 2021-05-08 DIAGNOSIS — K635 Polyp of colon: Secondary | ICD-10-CM | POA: Diagnosis not present

## 2021-05-08 HISTORY — PX: BIOPSY: SHX5522

## 2021-05-08 HISTORY — PX: COLONOSCOPY WITH PROPOFOL: SHX5780

## 2021-05-08 HISTORY — PX: ESOPHAGOGASTRODUODENOSCOPY (EGD) WITH PROPOFOL: SHX5813

## 2021-05-08 HISTORY — PX: POLYPECTOMY: SHX5525

## 2021-05-08 SURGERY — ESOPHAGOGASTRODUODENOSCOPY (EGD) WITH PROPOFOL
Anesthesia: Monitor Anesthesia Care | Laterality: Bilateral

## 2021-05-08 MED ORDER — SODIUM CHLORIDE 0.9 % IV SOLN: INTRAVENOUS | Status: DC

## 2021-05-08 MED ORDER — PROPOFOL 1000 MG/100ML IV EMUL
INTRAVENOUS | Status: AC
  Filled 2021-05-08: qty 100

## 2021-05-08 MED ORDER — PROPOFOL 10 MG/ML IV BOLUS
INTRAVENOUS | Status: DC | PRN
  Administered 2021-05-08: 10 mg via INTRAVENOUS
  Administered 2021-05-08 (×2): 20 mg via INTRAVENOUS

## 2021-05-08 MED ORDER — PROPOFOL 500 MG/50ML IV EMUL
INTRAVENOUS | Status: DC | PRN
  Administered 2021-05-08: 130 ug/kg/min via INTRAVENOUS

## 2021-05-08 MED ORDER — LACTATED RINGERS IV SOLN: INTRAVENOUS | Status: DC

## 2021-05-08 SURGICAL SUPPLY — 25 items

## 2021-05-08 NOTE — Discharge Instructions (Signed)

## 2021-05-08 NOTE — H&P (View-Only) (Signed)
Date of Initial H&P: 05/01/21  History reviewed, patient examined, no change in status, stable for surgery.

## 2021-05-08 NOTE — Op Note (Signed)
[facility] Patient Name: Shawn Mckenzie Procedure Date: 05/08/2021 MRN: [id number] Attending MD: [name] , MD Date of Birth: Aug 19, 1975 CSN: [id number] Age: 46 Admit Type: Outpatient Procedure:                Colonoscopy Indications:              Screening in patient at increased risk: Colorectal                            cancer in father 64 or older, Colon cancer                            screening in patient at increased risk: Family                            history of colorectal cancer in multiple 2nd degree                            relatives, Last colonoscopy: March 2020 Providers:                [name], MD, Jaci Carrel, RN, Cletis Athens, Technician Referring MD:             Seward Carol Medicines:                Propofol per Anesthesia, Monitored Anesthesia Care Complications:            No immediate complications. Estimated Blood Loss:     Estimated blood loss was minimal. Procedure:                Pre-Anesthesia Assessment:                           - Prior to the procedure, a History and Physical                            was performed, and patient medications and                            allergies were reviewed. The patient's tolerance of                            previous anesthesia was also reviewed. The risks                            and benefits of the procedure and the sedation                            options and risks were discussed with the patient.                            All questions were answered, and informed consent  was obtained. Prior Anticoagulants: The patient has                            taken no previous anticoagulant or antiplatelet                            agents. ASA Grade Assessment: III - A patient with                            severe systemic disease. After reviewing the risks                            and benefits, the patient was  deemed in                            satisfactory condition to undergo the procedure.                           After obtaining informed consent, the colonoscope                            was passed under direct vision. Throughout the                            procedure, the patient's blood pressure, pulse, and                            oxygen saturations were monitored continuously. The                            [id number]-HQ190L ([id number] Olympus colonoscope was                            introduced through the anus and advanced to the the                            cecum, identified by appendiceal orifice and                            ileocecal valve. The colonoscopy was performed                            without difficulty. The patient tolerated the                            procedure well. The quality of the bowel                            preparation was adequate and good. The terminal                            ileum, ileocecal valve, appendiceal orifice, and  rectum were photographed. Scope In: 9:00:32 AM Scope Out: 9:16:28 AM Scope Withdrawal Time: 0 hours 11 minutes 6 seconds  Total Procedure Duration: 0 hours 15 minutes 56 seconds  Findings:      The perianal and digital rectal examinations were normal.      A 4 mm polyp was found in the ascending colon. The polyp was       semi-sessile. The polyp was removed with a hot snare. Resection and       retrieval were complete. Estimated blood loss: none.      A 2 mm polyp was found in the cecum. The polyp was semi-sessile. The       polyp was removed with a cold biopsy forceps. Resection and retrieval       were complete. Estimated blood loss was minimal.      Internal hemorrhoids were found during retroflexion. The hemorrhoids       were medium-sized and Grade I (internal hemorrhoids that do not       prolapse).      The terminal ileum appeared normal. Impression:               - One 4 mm polyp in the  ascending colon, removed                            with a hot snare. Resected and retrieved.                           - One 2 mm polyp in the cecum, removed with a cold                            biopsy forceps. Resected and retrieved.                           - Internal hemorrhoids.                           - The examined portion of the ileum was normal. Moderate Sedation:      N/A - MAC procedure Recommendation:           - Patient has a contact number available for                            emergencies. The signs and symptoms of potential                            delayed complications were discussed with the                            patient. Return to normal activities tomorrow.                            Written discharge instructions were provided to the                            patient.                           -  High fiber diet.                           - Await pathology results.                           - Repeat colonoscopy for surveillance based on                            pathology results.                           - Continue present medications. Procedure Code(s):        --- Professional ---                           [phone], Colonoscopy, flexible; with removal of                            tumor(s), polyp(s), or other lesion(s) by snare                            technique                           45380, 81, Colonoscopy, flexible; with biopsy,                            single or multiple Diagnosis Code(s):        --- Professional ---                           Z80.0, Family history of malignant neoplasm of                            digestive organs                           K63.5, Polyp of colon                           K64.0, First degree hemorrhoids CPT copyright 2019 American Medical Association. All rights reserved. The codes documented in this report are preliminary and upon coder review may  be revised to meet current compliance  requirements. [name], MD 05/08/2021 9:34:03 AM This report has been signed electronically. Number of Addenda: 0

## 2021-05-08 NOTE — Consult Note (Signed)
Date of Initial H&P: 05/01/21  History reviewed, patient examined, no change in status, stable for surgery.

## 2021-05-08 NOTE — Transfer of Care (Signed)
Immediate Anesthesia Transfer of Care Note  Patient: Shawn Mckenzie  Procedure(s) Performed: ESOPHAGOGASTRODUODENOSCOPY (EGD) WITH PROPOFOL (Bilateral) COLONOSCOPY WITH PROPOFOL (Bilateral) BIOPSY POLYPECTOMY  Patient Location: PACU  Anesthesia Type:MAC  Level of Consciousness: sedated  Airway & Oxygen Therapy: Patient Spontanous Breathing and Patient connected to face mask oxygen  Post-op Assessment: Report given to RN and Post -op Vital signs reviewed and stable  Post vital signs: Reviewed and stable  Last Vitals:  Vitals Value Taken Time  BP 181/129 05/08/21 0921  Temp    Pulse 90 05/08/21 0923  Resp 29 05/08/21 0923  SpO2 97 % 05/08/21 0923  Vitals shown include unvalidated device data.  Last Pain:  Vitals:   05/08/21 0754  TempSrc: Oral         Complications: No notable events documented.

## 2021-05-08 NOTE — Op Note (Signed)
[facility] Patient Name: Shawn Mckenzie Procedure Date: 05/08/2021 MRN: [id number] Attending MD: [name] , MD Date of Birth: 04-02-1976 CSN: [id number] Age: 46 Admit Type: Outpatient Procedure:                Upper GI endoscopy Indications:              Dysphagia Providers:                [name], MD, Jaci Carrel, RN, Cletis Athens, Technician, [place] Alday CRNA, CRNA Referring MD:             Seward Carol Medicines:                Propofol per Anesthesia, Monitored Anesthesia Care Complications:            No immediate complications. Estimated Blood Loss:     Estimated blood loss was minimal. Procedure:                Pre-Anesthesia Assessment:                           - Prior to the procedure, a History and Physical                            was performed, and patient medications and                            allergies were reviewed. The patient's tolerance of                            previous anesthesia was also reviewed. The risks                            and benefits of the procedure and the sedation                            options and risks were discussed with the patient.                            All questions were answered, and informed consent                            was obtained. Prior Anticoagulants: The patient has                            taken no previous anticoagulant or antiplatelet                            agents. ASA Grade Assessment: III - A patient with                            severe systemic disease. After reviewing the risks  and benefits, the patient was deemed in                            satisfactory condition to undergo the procedure.                           After obtaining informed consent, the endoscope was                            passed under direct vision. Throughout the                            procedure, the patient's blood  pressure, pulse, and                            oxygen saturations were monitored continuously. The                            [id number]-H190 ([id number]) Olympus endoscope was introduced                            through the mouth, and advanced to the second part                            of duodenum. The upper GI endoscopy was                            accomplished without difficulty. The patient                            tolerated the procedure well. Scope In: Scope Out: Findings:      Mucosal changes including longitudinal furrows were found in the distal       esophagus. Biopsies were taken with a cold forceps for histology.       Estimated blood loss was minimal.      The Z-line was regular and was found 38 cm from the incisors.      There is no endoscopic evidence of stenosis or stricture in the entire       esophagus.      Patchy moderate inflammation characterized by congestion (edema),       erosions and erythema was found in the gastric antrum and in the       prepyloric region of the stomach. Biopsies were taken with a cold       forceps for histology. Estimated blood loss was minimal.      Multiple small sessile polyps with no bleeding and no stigmata of recent       bleeding were found in the gastric fundus.      The examined duodenum was normal. Impression:               - Esophageal mucosal changes suspicious for                            eosinophilic esophagitis. Biopsied.                           -  Z-line regular, 38 cm from the incisors.                           - Acute gastritis. Biopsied.                           - Multiple gastric polyps.                           - Normal examined duodenum. Moderate Sedation:      N/A - MAC procedure Recommendation:           - Patient has a contact number available for                            emergencies. The signs and symptoms of potential                            delayed complications were discussed with the                             patient. Return to normal activities tomorrow.                            Written discharge instructions were provided to the                            patient.                           - Await pathology results. Procedure Code(s):        --- Professional ---                           [phone], Esophagogastroduodenoscopy, flexible,                            transoral; with biopsy, single or multiple Diagnosis Code(s):        --- Professional ---                           R13.10, Dysphagia, unspecified                           K29.00, Acute gastritis without bleeding                           K31.7, Polyp of stomach and duodenum                           K22.8, Other specified diseases of esophagus CPT copyright 2019 American Medical Association. All rights reserved. The codes documented in this report are preliminary and upon coder review may  be revised to meet current compliance requirements. [name], MD 05/08/2021 9:25:32 AM This report has been signed electronically. Number of Addenda: 0

## 2021-05-08 NOTE — Interval H&P Note (Signed)
History and Physical Interval Note:  05/08/2021 8:40 AM  Shawn Mckenzie  has presented today for surgery, with the diagnosis of dysphagia, family history of colon cancer.  The various methods of treatment have been discussed with the patient and family. After consideration of risks, benefits and other options for treatment, the patient has consented to  Procedure(s): ESOPHAGOGASTRODUODENOSCOPY (EGD) WITH PROPOFOL (Bilateral) BALLOON DILATION (Bilateral) COLONOSCOPY WITH PROPOFOL (Bilateral) as a surgical intervention.  The patient's history has been reviewed, patient examined, no change in status, stable for surgery.  I have reviewed the patient's chart and labs.  Questions were answered to the patient's satisfaction.     Shawn Mckenzie

## 2021-05-08 NOTE — Anesthesia Postprocedure Evaluation (Signed)
Anesthesia Post Note  Patient: Shawn Mckenzie  Procedure(s) Performed: ESOPHAGOGASTRODUODENOSCOPY (EGD) WITH PROPOFOL (Bilateral) COLONOSCOPY WITH PROPOFOL (Bilateral) BIOPSY POLYPECTOMY     Patient location during evaluation: PACU Anesthesia Type: MAC Level of consciousness: awake and alert Pain management: pain level controlled Vital Signs Assessment: post-procedure vital signs reviewed and stable Respiratory status: spontaneous breathing, nonlabored ventilation, respiratory function stable and patient connected to nasal cannula oxygen Cardiovascular status: stable and blood pressure returned to baseline Postop Assessment: no apparent nausea or vomiting Anesthetic complications: no   No notable events documented.  Last Vitals:  Vitals:   05/08/21 0940 05/08/21 0950  BP: 124/63 119/79  Pulse: 77 76  Resp: (!) 24 13  Temp:    SpO2: 94% 97%    Last Pain:  Vitals:   05/08/21 0950  TempSrc:   PainSc: 0-No pain                 Santa Lighter

## 2021-05-09 ENCOUNTER — Encounter (HOSPITAL_COMMUNITY): Payer: Self-pay | Admitting: Gastroenterology

## 2021-05-09 LAB — SURGICAL PATHOLOGY

## 2021-05-25 DIAGNOSIS — G4733 Obstructive sleep apnea (adult) (pediatric): Secondary | ICD-10-CM | POA: Diagnosis not present

## 2021-05-30 DIAGNOSIS — E230 Hypopituitarism: Secondary | ICD-10-CM | POA: Diagnosis not present

## 2021-05-30 DIAGNOSIS — E221 Hyperprolactinemia: Secondary | ICD-10-CM | POA: Diagnosis not present

## 2021-06-05 DIAGNOSIS — M654 Radial styloid tenosynovitis [de Quervain]: Secondary | ICD-10-CM | POA: Diagnosis not present

## 2021-07-03 ENCOUNTER — Other Ambulatory Visit (HOSPITAL_COMMUNITY): Payer: Self-pay

## 2021-07-07 ENCOUNTER — Other Ambulatory Visit (HOSPITAL_COMMUNITY): Payer: Self-pay

## 2021-07-21 ENCOUNTER — Other Ambulatory Visit (HOSPITAL_COMMUNITY): Payer: Self-pay

## 2021-09-25 ENCOUNTER — Other Ambulatory Visit (HOSPITAL_COMMUNITY): Payer: Self-pay

## 2021-09-25 MED ORDER — LOSARTAN POTASSIUM 50 MG PO TABS
50.0000 mg | ORAL_TABLET | Freq: Every day | ORAL | 3 refills | Status: DC
  Filled 2021-09-25: qty 90, 90d supply, fill #0
  Filled 2022-01-28: qty 90, 90d supply, fill #1
  Filled 2022-04-25 – 2022-04-27 (×2): qty 90, 90d supply, fill #2

## 2021-10-16 ENCOUNTER — Other Ambulatory Visit (HOSPITAL_COMMUNITY): Payer: Self-pay

## 2021-10-19 ENCOUNTER — Other Ambulatory Visit (HOSPITAL_COMMUNITY): Payer: Self-pay

## 2021-10-19 DIAGNOSIS — R1310 Dysphagia, unspecified: Secondary | ICD-10-CM | POA: Diagnosis not present

## 2021-10-19 DIAGNOSIS — R1319 Other dysphagia: Secondary | ICD-10-CM | POA: Diagnosis not present

## 2021-10-19 MED ORDER — RABEPRAZOLE SODIUM 20 MG PO TBEC
20.0000 mg | DELAYED_RELEASE_TABLET | Freq: Two times a day (BID) | ORAL | 0 refills | Status: DC
  Filled 2021-10-19: qty 60, 30d supply, fill #0
  Filled 2021-10-19 – 2021-10-29 (×2): qty 180, 90d supply, fill #0

## 2021-10-22 ENCOUNTER — Other Ambulatory Visit (HOSPITAL_COMMUNITY): Payer: Self-pay

## 2021-10-29 ENCOUNTER — Other Ambulatory Visit (HOSPITAL_COMMUNITY): Payer: Self-pay

## 2021-10-29 MED ORDER — RABEPRAZOLE SODIUM 20 MG PO TBEC
20.0000 mg | DELAYED_RELEASE_TABLET | Freq: Every day | ORAL | 3 refills | Status: DC
  Filled 2021-10-29: qty 90, 90d supply, fill #0
  Filled 2022-01-17: qty 90, 90d supply, fill #1
  Filled 2022-04-25 – 2022-04-27 (×2): qty 90, 90d supply, fill #2

## 2021-11-14 DIAGNOSIS — Z0000 Encounter for general adult medical examination without abnormal findings: Secondary | ICD-10-CM | POA: Diagnosis not present

## 2021-11-14 DIAGNOSIS — K2289 Other specified disease of esophagus: Secondary | ICD-10-CM | POA: Diagnosis not present

## 2021-11-14 DIAGNOSIS — R1314 Dysphagia, pharyngoesophageal phase: Secondary | ICD-10-CM | POA: Diagnosis not present

## 2021-11-15 ENCOUNTER — Other Ambulatory Visit (HOSPITAL_COMMUNITY): Payer: Self-pay

## 2021-11-15 ENCOUNTER — Ambulatory Visit (INDEPENDENT_AMBULATORY_CARE_PROVIDER_SITE_OTHER): Payer: 59 | Admitting: Cardiology

## 2021-11-15 ENCOUNTER — Encounter: Payer: Self-pay | Admitting: Cardiology

## 2021-11-15 VITALS — BP 130/88 | HR 76 | Ht 67.0 in | Wt 346.0 lb

## 2021-11-15 DIAGNOSIS — I10 Essential (primary) hypertension: Secondary | ICD-10-CM

## 2021-11-15 DIAGNOSIS — E7800 Pure hypercholesterolemia, unspecified: Secondary | ICD-10-CM

## 2021-11-15 DIAGNOSIS — R0609 Other forms of dyspnea: Secondary | ICD-10-CM | POA: Diagnosis not present

## 2021-11-15 DIAGNOSIS — R072 Precordial pain: Secondary | ICD-10-CM | POA: Diagnosis not present

## 2021-11-15 MED ORDER — METOPROLOL TARTRATE 100 MG PO TABS
100.0000 mg | ORAL_TABLET | Freq: Once | ORAL | 0 refills | Status: DC
  Filled 2021-11-15 – 2021-11-29 (×2): qty 1, 1d supply, fill #0

## 2021-11-15 NOTE — Patient Instructions (Signed)
Medication Instructions:  Your physician recommends that you continue on your current medications as directed. Please refer to the Current Medication list given to you today.  *If you need a refill on your cardiac medications before your next appointment, please call your pharmacy*   Lab Work: CBC, BMET If you have labs (blood work) drawn today and your tests are completely normal, you will receive your results only by: [place] (if you have MyChart) OR A paper copy in the mail If you have any lab test that is abnormal or we need to change your treatment, we will call you to review the results.   Testing/Procedures: ECHO Your physician has requested that you have an echocardiogram. Echocardiography is a painless test that uses sound waves to create images of your heart. It provides your doctor with information about the size and shape of your heart and how well your heart's chambers and valves are working. This procedure takes approximately one hour. There are no restrictions for this procedure.  Coronary CT   Follow-Up: At [facility], you and your health needs are our priority.  As part of our continuing mission to provide you with exceptional heart care, we have created designated Provider Care Teams.  These Care Teams include your primary Cardiologist (physician) and Advanced Practice Providers (APPs -  Physician Assistants and Nurse Practitioners) who all work together to provide you with the care you need, when you need it.   Follow up as needed  The format for your next appointment:   In Person  Provider:   Dr. Marlou Porch   Other Instructions   Your cardiac CT will be scheduled at one of the below locations:   [facility] [address] [place], [place] [address] [phone]  If scheduled at [facility], please arrive at the [facility] and Children's Entrance (Entrance C2) of [facility] 30 minutes prior to test start time. You can  use the FREE valet parking offered at entrance C (encouraged to control the heart rate for the test)  Proceed to the [facility] Radiology Department (first floor) to check-in and test prep.  All radiology patients and guests should use entrance C2 at [facility], accessed from [facility], even though the hospital's physical address listed is [address]    Please follow these instructions carefully (unless otherwise directed):  Hold all erectile dysfunction medications at least 3 days (72 hrs) prior to test.  On the Night Before the Test: Be sure to Drink plenty of water. Do not consume any caffeinated/decaffeinated beverages or chocolate 12 hours prior to your test. Do not take any antihistamines 12 hours prior to your test.   On the Day of the Test: Drink plenty of water until 1 hour prior to the test. Do not eat any food 4 hours prior to the test. You may take your regular medications prior to the test.  Take metoprolol (Lopressor) two hours prior to test. HOLD Furosemide/Hydrochlorothiazide morning of the test.   After the Test: Drink plenty of water. After receiving IV contrast, you may experience a mild flushed feeling. This is normal. On occasion, you may experience a mild rash up to 24 hours after the test. This is not dangerous. If this occurs, you can take Benadryl 25 mg and increase your fluid intake. If you experience trouble breathing, this can be serious. If it is severe call 911 IMMEDIATELY. If it is mild, please call our office. If you  take any of these medications: Glipizide/Metformin, Avandament, Glucavance, please do not take 48 hours after completing test unless otherwise instructed.  We will call to schedule your test 2-4 weeks out understanding that some insurance companies will need an authorization prior to the service being performed.   For non-scheduling related questions, please contact the cardiac imaging nurse navigator  should you have any questions/concerns: Marchia Bond, Cardiac Imaging Nurse Navigator Gordy Clement, Cardiac Imaging Nurse Navigator [facility] Direct Office Dial: [phone]   For scheduling needs, including cancellations and rescheduling, please call [place], [phone].

## 2021-11-15 NOTE — Progress Notes (Signed)
Cardiology Office Note:    Date:  11/15/2021   ID:  Shawn Mckenzie, DOB 02-Mar-1976, MRN [id number]  PCP:  Shawn Carol, MD   [facility] Providers Cardiologist:  None     Referring MD: Shawn Carol, MD    History of Present Illness:    Shawn Mckenzie is a 46 y.o. male here for evaluation of chest pain/shortness of breath at the request of Dr. Delfina Mckenzie.  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  Approximately 2 weeks ago at night, using CPAP, felt like could not breathe.  Lasted for few hours duration.  Occurred starting approximately midnight .  Next day ended up going to work. Few hours later felt constant pressure and squeezing in his chest.  He stated that he had to stop to take a breath when working and talking.  Wife face timed him saw something was wrong.   Mother called.  She was concerned. Next morning felt better, playing with kid. Fine for the week.   Went back to work next week and felt pressure and SOB.   Sone SOB with stairs but no squeezing with stars.  When talking with patients feel like can't catch breath.   Has had anxiety in the past. Sweats as manifestation.  The symptoms seem to have started approximately 3 weeks ago . SOB. Sings to son at night. Harder to hold long notes.   No recent travel. No long flights.  No long car trips recently.  No history of prior thrombotic disorder.  23 year old son.  He is originally from [place].  Mom - HTN Father - Cancer  Non smoker.  Drinks coffee.  No other supplements.    Past Medical History:  Diagnosis Date   Hypercholesteremia    Hypertension    Muscle spasm    Seasonal allergies    Sleep apnea     Past Surgical History:  Procedure Laterality Date   BIOPSY  05/08/2021   Procedure: BIOPSY;  Surgeon: Wilford Corner, MD;  Location: [facility];  Service: Endoscopy;;  EGD and COLON   COLONOSCOPY WITH PROPOFOL Bilateral 05/08/2021   Procedure: COLONOSCOPY WITH PROPOFOL;  Surgeon: Wilford Corner, MD;  Location: [facility];  Service: Endoscopy;  Laterality: Bilateral;   ESOPHAGOGASTRODUODENOSCOPY (EGD) WITH PROPOFOL Bilateral 05/08/2021   Procedure: ESOPHAGOGASTRODUODENOSCOPY (EGD) WITH PROPOFOL;  Surgeon: Wilford Corner, MD;  Location: [facility];  Service: Endoscopy;  Laterality: Bilateral;   No past surgery     POLYPECTOMY  05/08/2021   Procedure: POLYPECTOMY;  Surgeon: Wilford Corner, MD;  Location: [facility];  Service: Endoscopy;;    Current Medications: Current Meds  Medication Sig   atorvastatin (LIPITOR) 20 MG tablet Take 1 tablet (20 mg total) by mouth daily.   cabergoline (DOSTINEX) 0.5 MG tablet TAKE 1/2 TABLET BY MOUTH 2 TIMES A WEEK   Glycerin-Hypromellose-PEG 400 (DRY EYE RELIEF DROPS) 0.2-0.2-1 % SOLN Place 1 drop into both eyes daily as needed (dry eye).   levocetirizine (XYZAL) 5 MG tablet Take 5 mg by mouth daily as needed (Seasonal).   losartan (COZAAR) 50 MG tablet Take 1 tablet (50 mg total) by mouth daily.   metoprolol tartrate (LOPRESSOR) 100 MG tablet Take 1 tablet (100 mg total) by mouth once for 1 dose. Take 1 tablet (100 mg total) two hours prior to CT scan.   RABEprazole (ACIPHEX) 20 MG tablet Take 1 tablet (20 mg total) by mouth daily.     Allergies:   Latex, Penicillins, and Fish  allergy   Social History   Socioeconomic History   Marital status: Married    Spouse name: Not on file   Number of children: Not on file   Years of education: college   Highest education level: Professional school degree (e.g., MD, DDS, DVM, JD)  Occupational History   Occupation: physician  Tobacco Use   Smoking status: Never   Smokeless tobacco: Never  Vaping Use   Vaping Use: Never used  Substance and Sexual Activity   Alcohol use: Yes    Comment: 1 glass of scotch 2-3 times per week   Drug use: Never   Sexual activity: Not on file  Other Topics Concern   Not on file  Social History Narrative   Caffeine use: 12oz mug, 4-5 times per week.    Ambidextrous.   Lives at home with wife.   His first child is due 11/23/19 (son).   Social Determinants of Health   Financial Resource Strain: Not on file  Food Insecurity: Not on file  Transportation Needs: Not on file  Physical Activity: Not on file  Stress: Not on file  Social Connections: Not on file     Family History: The patient's family history includes Colon cancer (age of onset: 63) in his paternal uncle; Colon cancer (age of onset: 74) in his father; Colon cancer (age of onset: 90) in his paternal grandmother; Hypercholesterolemia in his mother; Hypertension in his mother; Lung cancer (age of onset: 42) in his maternal grandfather; Ovarian cancer (age of onset: 24) in his paternal aunt.  ROS:   Please see the history of present illness.    No syncope fevers chills nausea all other systems reviewed and are negative.  EKGs/Labs/Other Studies Reviewed:    The following studies were reviewed today: LDL 52, triglycerides 67 TSH 1.09 creatinine 1.09 hemoglobin A1c 5.2 hemoglobin 15.4 at last check in August 2022  EKG:  EKG is  ordered today.  The ekg ordered today demonstrates sinus rhythm borderline LVH pattern  Recent Labs: No results found for requested labs within last 365 days.  Recent Lipid Panel No results found for: "CHOL", "TRIG", "HDL", "CHOLHDL", "VLDL", "LDLCALC", "LDLDIRECT"   Risk Assessment/Calculations:              Physical Exam:    VS:  BP 130/88   Pulse 76   Ht '5\' 7"'$  (1.702 m)   Wt (!) 346 lb (156.9 kg)   SpO2 97%   BMI 54.19 kg/m     Wt Readings from Last 3 Encounters:  11/15/21 (!) 346 lb (156.9 kg)  05/08/21 (!) 340 lb (154.2 kg)  02/07/21 (!) 344 lb (156 kg)     GEN:  Well nourished, well developed in no acute distress HEENT: Normal NECK: No JVD; No carotid bruits LYMPHATICS: No lymphadenopathy CARDIAC: RRR, no murmurs, no rubs, gallops RESPIRATORY:  Clear to auscultation without rales, wheezing or rhonchi  ABDOMEN: Soft,  non-tender, non-distended MUSCULOSKELETAL:  No edema; No deformity  SKIN: Warm and dry NEUROLOGIC:  Alert and oriented x 3 PSYCHIATRIC:  Normal affect   ASSESSMENT:    1. Precordial pain   2. Dyspnea on exertion   3. Pure hypercholesterolemia   4. HTN (hypertension), benign    PLAN:    In order of problems listed above:  Chest discomfort Shortness of breath - We will go ahead and check an echocardiogram to ensure proper structure and function of his heart. - We will also check a coronary CT scan with possible  FFR analysis to ensure that there is no evidence of coronary stenosis, plaque, obvious pulmonary thrombosis etc. - We will follow-up with results of studies.  Hyperlipidemia - Continue with atorvastatin 10 mg a day.  Excellent results.  LDL 52.  Hypertension - Continuing with losartan 50 mg a day.  Excellent.  Slightly elevated diastolic today but overall under good control. -To continue to encourage lifestyle modifications, weight loss.  He is an Geophysicist/field seismologist, enjoys cooking.       Medication Adjustments/Labs and Tests Ordered: Current medicines are reviewed at length with the patient today.  Concerns regarding medicines are outlined above.  Orders Placed This Encounter  Procedures   CT CORONARY MORPH W/CTA COR W/SCORE W/CA W/CM &/OR WO/CM   CBC   Basic metabolic panel   EKG [id number]   ECHOCARDIOGRAM COMPLETE   Meds ordered this encounter  Medications   metoprolol tartrate (LOPRESSOR) 100 MG tablet    Sig: Take 1 tablet (100 mg total) by mouth once for 1 dose. Take 1 tablet (100 mg total) two hours prior to CT scan.    Dispense:  1 tablet    Refill:  0    Patient Instructions  Medication Instructions:  Your physician recommends that you continue on your current medications as directed. Please refer to the Current Medication list given to you today.  *If you need a refill on your cardiac medications before your next appointment, please call your  pharmacy*   Lab Work: CBC, BMET If you have labs (blood work) drawn today and your tests are completely normal, you will receive your results only by: [place] (if you have MyChart) OR A paper copy in the mail If you have any lab test that is abnormal or we need to change your treatment, we will call you to review the results.   Testing/Procedures: ECHO Your physician has requested that you have an echocardiogram. Echocardiography is a painless test that uses sound waves to create images of your heart. It provides your doctor with information about the size and shape of your heart and how well your heart's chambers and valves are working. This procedure takes approximately one hour. There are no restrictions for this procedure.  Coronary CT   Follow-Up: At [facility], you and your health needs are our priority.  As part of our continuing mission to provide you with exceptional heart care, we have created designated Provider Care Teams.  These Care Teams include your primary Cardiologist (physician) and Advanced Practice Providers (APPs -  Physician Assistants and Nurse Practitioners) who all work together to provide you with the care you need, when you need it.   Follow up as needed  The format for your next appointment:   In Person  Provider:   Dr. Marlou Porch   Other Instructions   Your cardiac CT will be scheduled at one of the below locations:   [facility] [address] [place], [place] [address] [phone]  If scheduled at [facility], please arrive at the [facility] and Children's Entrance (Entrance C2) of [facility] 30 minutes prior to test start time. You can use the FREE valet parking offered at entrance C (encouraged to control the heart rate for the test)  Proceed to the [facility] Radiology Department (first floor) to check-in and test prep.  All radiology patients and guests should use entrance C2 at [facility],  accessed from [facility], even though the hospital's physical address listed is  [address].    Please follow these instructions carefully (unless otherwise directed):  Hold all erectile dysfunction medications at least 3 days (72 hrs) prior to test.  On the Night Before the Test: Be sure to Drink plenty of water. Do not consume any caffeinated/decaffeinated beverages or chocolate 12 hours prior to your test. Do not take any antihistamines 12 hours prior to your test.   On the Day of the Test: Drink plenty of water until 1 hour prior to the test. Do not eat any food 4 hours prior to the test. You may take your regular medications prior to the test.  Take metoprolol (Lopressor) two hours prior to test. HOLD Furosemide/Hydrochlorothiazide morning of the test.   After the Test: Drink plenty of water. After receiving IV contrast, you may experience a mild flushed feeling. This is normal. On occasion, you may experience a mild rash up to 24 hours after the test. This is not dangerous. If this occurs, you can take Benadryl 25 mg and increase your fluid intake. If you experience trouble breathing, this can be serious. If it is severe call 911 IMMEDIATELY. If it is mild, please call our office. If you take any of these medications: Glipizide/Metformin, Avandament, Glucavance, please do not take 48 hours after completing test unless otherwise instructed.  We will call to schedule your test 2-4 weeks out understanding that some insurance companies will need an authorization prior to the service being performed.   For non-scheduling related questions, please contact the cardiac imaging nurse navigator should you have any questions/concerns: Marchia Bond, Cardiac Imaging Nurse Navigator Gordy Clement, Cardiac Imaging Nurse Navigator [facility] Direct Office Dial: [phone]   For scheduling needs, including cancellations and  rescheduling, please call [place], [phone].           Signed, Candee Furbish, MD  11/15/2021 10:05 AM    [facility]

## 2021-11-16 LAB — CBC
Hematocrit: 45.9 % (ref 37.5–51.0)
Hemoglobin: 16.1 g/dL (ref 13.0–17.7)
MCH: 31.1 pg (ref 26.6–33.0)
MCHC: 35.1 g/dL (ref 31.5–35.7)
MCV: 89 fL (ref 79–97)
Platelets: 194 10*3/uL (ref 150–450)
RBC: 5.17 x10E6/uL (ref 4.14–5.80)
RDW: 12.4 % (ref 11.6–15.4)
WBC: 5.4 10*3/uL (ref 3.4–10.8)

## 2021-11-16 LAB — BASIC METABOLIC PANEL
BUN/Creatinine Ratio: 12 (ref 9–20)
BUN: 11 mg/dL (ref 6–24)
CO2: 20 mmol/L (ref 20–29)
Calcium: 9.4 mg/dL (ref 8.7–10.2)
Chloride: 102 mmol/L (ref 96–106)
Creatinine, Ser: 0.93 mg/dL (ref 0.76–1.27)
Glucose: 92 mg/dL (ref 70–99)
Potassium: 4.4 mmol/L (ref 3.5–5.2)
Sodium: 139 mmol/L (ref 134–144)
eGFR: 104 mL/min/{1.73_m2} (ref >59)

## 2021-11-26 ENCOUNTER — Other Ambulatory Visit (HOSPITAL_COMMUNITY): Payer: Self-pay

## 2021-11-27 DIAGNOSIS — G4733 Obstructive sleep apnea (adult) (pediatric): Secondary | ICD-10-CM | POA: Diagnosis not present

## 2021-11-29 ENCOUNTER — Other Ambulatory Visit (HOSPITAL_COMMUNITY): Payer: Self-pay

## 2021-11-29 ENCOUNTER — Ambulatory Visit (HOSPITAL_COMMUNITY): Payer: 59 | Attending: Cardiology

## 2021-11-29 DIAGNOSIS — R0609 Other forms of dyspnea: Secondary | ICD-10-CM | POA: Insufficient documentation

## 2021-11-29 DIAGNOSIS — R072 Precordial pain: Secondary | ICD-10-CM | POA: Insufficient documentation

## 2021-11-29 LAB — ECHOCARDIOGRAM COMPLETE
Area-P 1/2: 3.2 cm2
S' Lateral: 2.5 cm

## 2021-12-12 ENCOUNTER — Telehealth (HOSPITAL_COMMUNITY): Payer: Self-pay | Admitting: *Deleted

## 2021-12-12 NOTE — Telephone Encounter (Signed)
Attempted to call patient regarding upcoming cardiac CT appointment. °Left message on voicemail with name and callback number ° °Makeya Hilgert RN Navigator Cardiac Imaging °[facility] °[phone] Office °[phone] Cell ° °

## 2021-12-13 ENCOUNTER — Ambulatory Visit (HOSPITAL_COMMUNITY)
Admission: RE | Admit: 2021-12-13 | Discharge: 2021-12-13 | Disposition: A | Discharge status: Home / Self Care | Payer: 59 | Source: Ambulatory Visit | Attending: Cardiology | Admitting: Cardiology

## 2021-12-13 DIAGNOSIS — R072 Precordial pain: Secondary | ICD-10-CM | POA: Insufficient documentation

## 2021-12-13 MED ORDER — NITROGLYCERIN 0.4 MG SL SUBL
SUBLINGUAL_TABLET | SUBLINGUAL | Status: AC
  Filled 2021-12-13: qty 2

## 2021-12-13 MED ORDER — NITROGLYCERIN 0.4 MG SL SUBL
0.8000 mg | SUBLINGUAL_TABLET | Freq: Once | SUBLINGUAL | Status: AC
  Administered 2021-12-13: 0.8 mg via SUBLINGUAL

## 2021-12-13 MED ORDER — IOHEXOL 350 MG/ML SOLN
100.0000 mL | Freq: Once | INTRAVENOUS | Status: AC | PRN
  Administered 2021-12-13: 100 mL via INTRAVENOUS

## 2021-12-14 DIAGNOSIS — E7800 Pure hypercholesterolemia, unspecified: Secondary | ICD-10-CM | POA: Diagnosis not present

## 2021-12-14 DIAGNOSIS — E221 Hyperprolactinemia: Secondary | ICD-10-CM | POA: Diagnosis not present

## 2021-12-14 DIAGNOSIS — G4730 Sleep apnea, unspecified: Secondary | ICD-10-CM | POA: Diagnosis not present

## 2021-12-14 DIAGNOSIS — I10 Essential (primary) hypertension: Secondary | ICD-10-CM | POA: Diagnosis not present

## 2021-12-14 DIAGNOSIS — Z0000 Encounter for general adult medical examination without abnormal findings: Secondary | ICD-10-CM | POA: Diagnosis not present

## 2021-12-14 DIAGNOSIS — Z800 Family history of malignant neoplasm of digestive organs: Secondary | ICD-10-CM | POA: Diagnosis not present

## 2021-12-14 DIAGNOSIS — K219 Gastro-esophageal reflux disease without esophagitis: Secondary | ICD-10-CM | POA: Diagnosis not present

## 2021-12-14 DIAGNOSIS — E291 Testicular hypofunction: Secondary | ICD-10-CM | POA: Diagnosis not present

## 2021-12-19 ENCOUNTER — Other Ambulatory Visit (HOSPITAL_COMMUNITY): Payer: Self-pay

## 2021-12-20 ENCOUNTER — Other Ambulatory Visit (HOSPITAL_COMMUNITY): Payer: Self-pay

## 2021-12-20 MED ORDER — CABERGOLINE 0.5 MG PO TABS
0.2500 mg | ORAL_TABLET | ORAL | 11 refills | Status: DC
  Filled 2021-12-20: qty 4, 28d supply, fill #0
  Filled 2022-01-17: qty 4, 28d supply, fill #1
  Filled 2022-02-18: qty 4, 28d supply, fill #2
  Filled 2022-03-21: qty 4, 28d supply, fill #3
  Filled 2022-04-25: qty 4, 28d supply, fill #4
  Filled 2022-04-27: qty 8, 56d supply, fill #4

## 2021-12-29 IMAGING — MR MR HEAD WO/W CM
32 series · 48 of 48 positions shown · IV contrast (10ml Multihance)
Comparison: None.

CLINICAL DATA: Hypogonadism, hyperprolactinemia.

EXAM:
MRI HEAD WITHOUT AND WITH CONTRAST
TECHNIQUE: Multiplanar, multiecho pulse sequences of the brain and surrounding
structures were obtained without and with intravenous contrast.
CONTRAST:  10mL MULTIHANCE GADOBENATE DIMEGLUMINE 529 MG/ML IV SOLN

[Series 5: T1 · sagittal · 4.0mm · 0.94mm/px · 1 of 31 slices shown (1 of 10)]
[im 1/31]
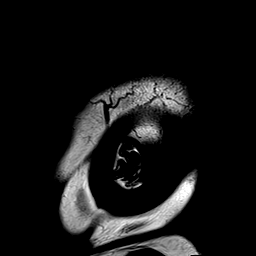

[Series 6: DWI · axial · 3.0mm · 1.44mm/px · z∈[-66,+115]mm · 3 of 111 slices shown (1 of 6)]
[im 1/111]
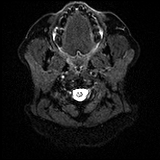
[im 56/111]
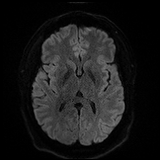
[im 111/111]
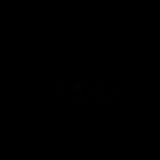

[Series 7: DWI · axial · 3.0mm · 1.44mm/px · 1 of 54 slices shown (2 of 6)]
[im 1/54]
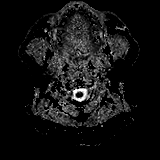

[Series 8: DWI · coronal · 5.0mm · 1.44mm/px · 2 of 72 slices shown (3 of 6)]
[im 1/72]
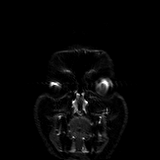
[im 72/72]
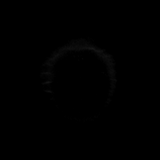

[Series 9: DWI · coronal · 5.0mm · 1.44mm/px · 1 of 36 slices shown (4 of 6)]
[im 1/36]
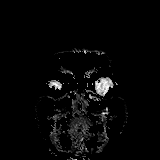

[Series 10: T2 · axial · 4.0mm · 0.36mm/px · 1 of 32 slices shown (1 of 3)]
[im 1/32]
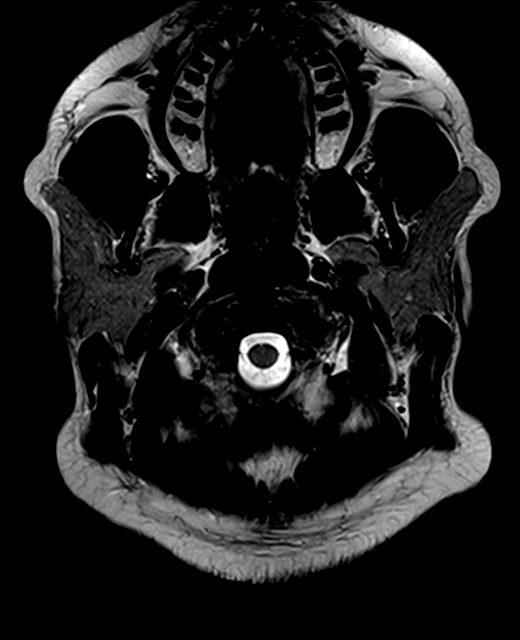

[Series 12: swi_images · axial · 1.5mm · 0.90mm/px · z∈[-49,+93]mm · 3 of 96 slices shown (1 of 2)]
[im 1/96]
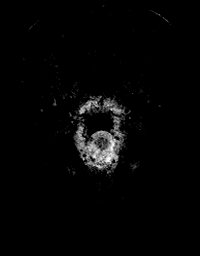
[im 48/96]
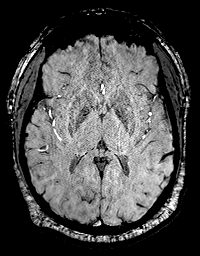
[im 96/96]
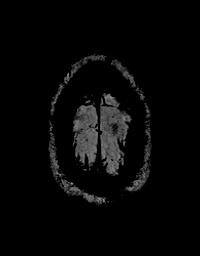

[Series 13: T1 · sagittal · 3.0mm · 0.50mm/px · 1 of 13 slices shown (2 of 10)]
[im 1/13]
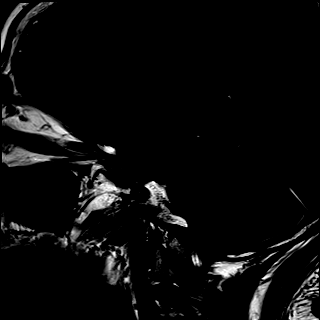

[Series 14: T1 · coronal · 3.0mm · 0.42mm/px · 1 of 13 slices shown (3 of 10)]
[im 1/13]
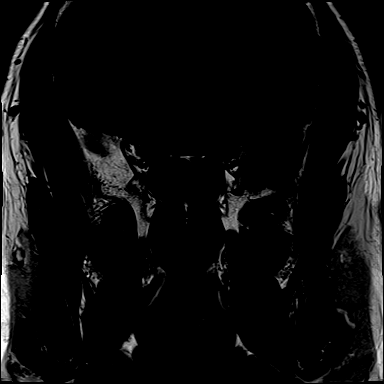

[Series 15: T1 · coronal · non-contrast · 3.0mm · 0.62mm/px · 1 of 16 slices shown (4 of 10)]
[im 1/16]
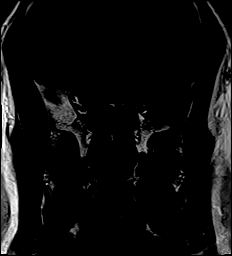

[Series 16: T1 · axial · 1.0mm · 0.94mm/px · z∈[-68,+91]mm · 5 of 160 slices shown (5 of 10)]
[im 1/160]
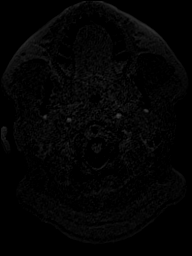
[im 40/160]
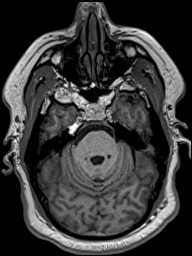
[im 80/160]
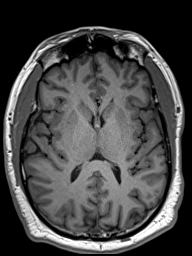
[im 120/160]
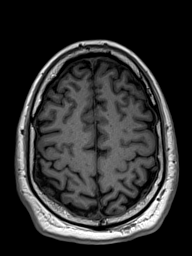
[im 160/160]
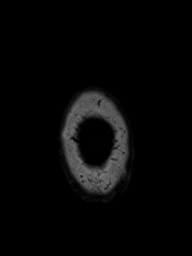

[Series 17: FLAIR · axial · 3.0mm · 0.72mm/px · 1 of 28 slices shown (1 of 2)]
[im 1/28]
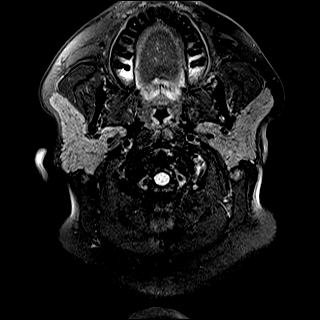

[Series 22: T1 · sagittal · 4.0mm · 0.78mm/px · 1 of 29 slices shown (6 of 10)]
[im 1/29]
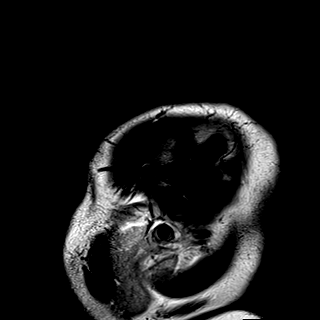

[Series 23: DWI · axial · 3.0mm · 1.56mm/px · z∈[-79,+80]mm · 2 of 84 slices shown (5 of 6)]
[im 1/84]
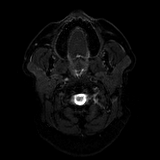
[im 84/84]
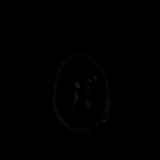

[Series 24: DWI · axial · 3.0mm · 1.56mm/px · 1 of 42 slices shown (6 of 6)]
[im 1/42]
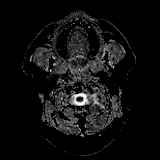

[Series 25: T2 · axial · 4.0mm · 0.36mm/px · 1 of 30 slices shown (2 of 3)]
[im 1/30]
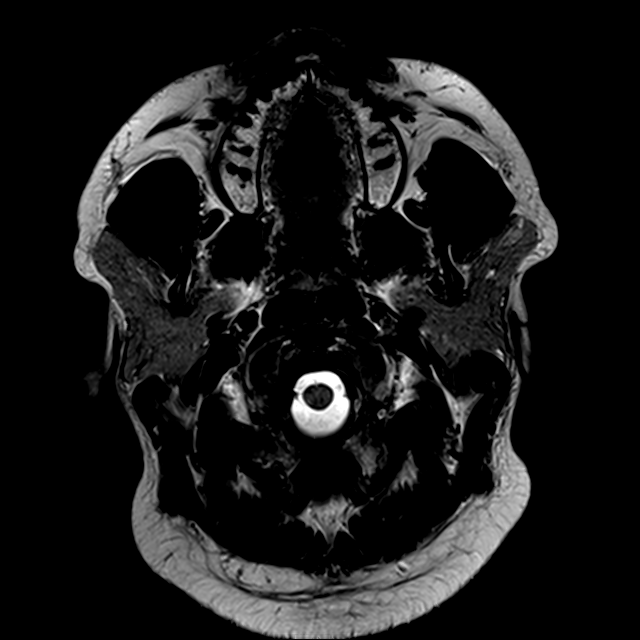

[Series 27: swi_images · axial · 1.5mm · 0.90mm/px · z∈[-77,+77]mm · 3 of 104 slices shown (2 of 2)]
[im 1/104]
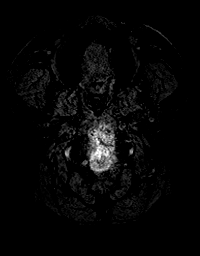
[im 52/104]
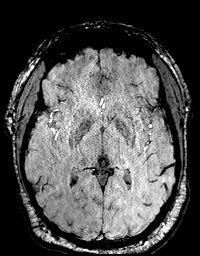
[im 104/104]
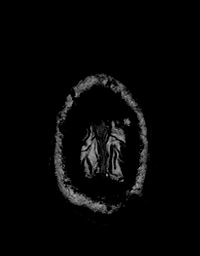

[Series 28: FLAIR · axial · 3.0mm · 0.72mm/px · 1 of 27 slices shown (2 of 2)]
[im 1/27]
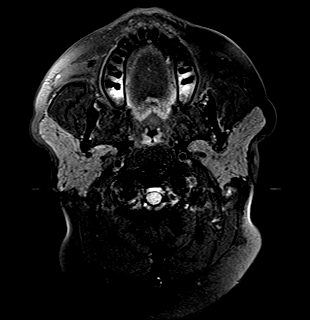

[Series 29: T1 · sagittal · 3.0mm · 0.42mm/px · 1 of 13 slices shown (7 of 10)]
[im 1/13]
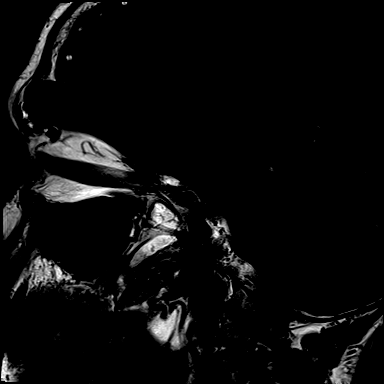

[Series 30: T2 · axial · 4.0mm · 0.36mm/px · 1 of 30 slices shown (3 of 3)]
[im 1/30]
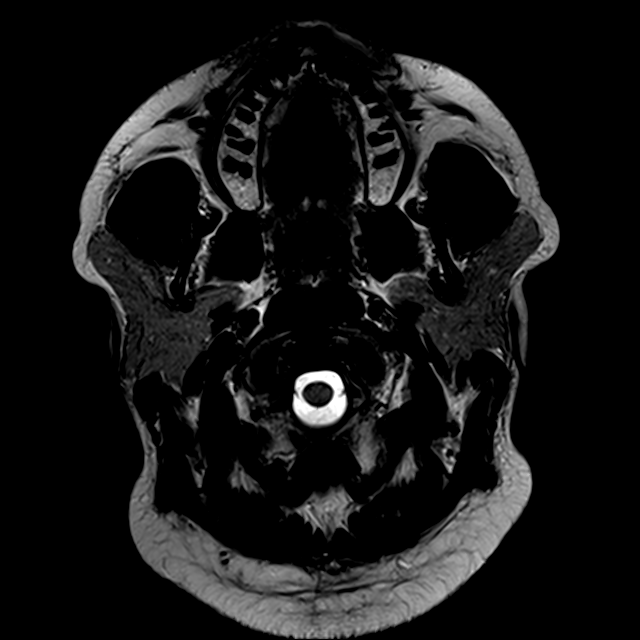

[Series 31: T1 · coronal · 3.0mm · 0.42mm/px · 1 of 10 slices shown (8 of 10)]
[im 1/10]
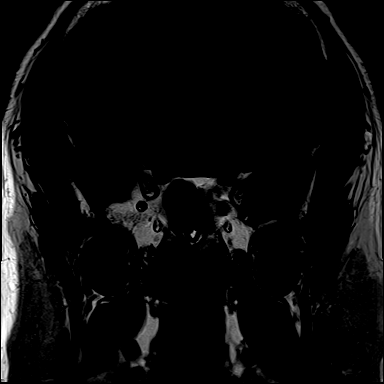

[Series 32: T1 · coronal · non-contrast · 3.0mm · 0.62mm/px · 1 of 11 slices shown (9 of 10)]
[im 1/11]
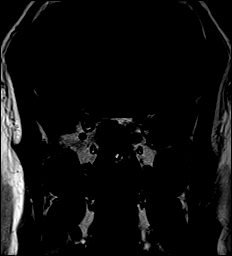

[Series 33: cor post dyn · coronal · 3.0mm · 0.62mm/px · 1 of 11 slices shown (1 of 6)]
[im 1/11]
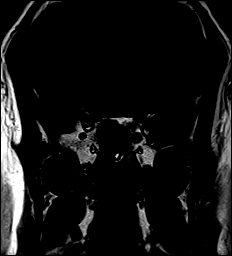

[Series 34: cor post dyn · coronal · 3.0mm · 0.62mm/px · 1 of 11 slices shown (2 of 6)]
[im 1/11]
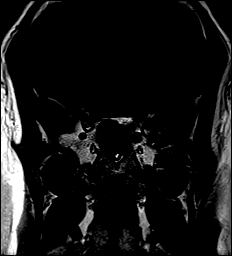

[Series 35: cor post dyn · coronal · 3.0mm · 0.62mm/px · 1 of 11 slices shown (3 of 6)]
[im 1/11]
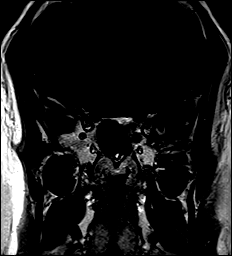

[Series 36: cor post dyn · coronal · 3.0mm · 0.62mm/px · 1 of 11 slices shown (4 of 6)]
[im 1/11]
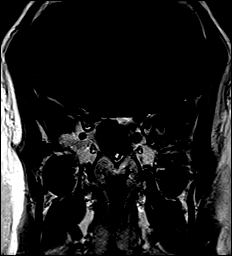

[Series 37: cor post dyn · coronal · 3.0mm · 0.62mm/px · 1 of 11 slices shown (5 of 6)]
[im 1/11]
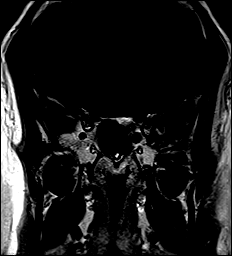

[Series 38: cor post dyn · coronal · 3.0mm · 0.62mm/px · 1 of 11 slices shown (6 of 6)]
[im 1/11]
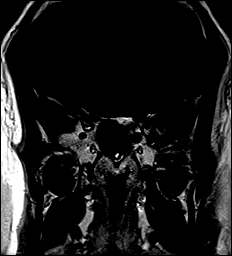

[Series 39: T1 post-contrast · sagittal · 3.0mm · 0.42mm/px · 1 of 13 slices shown (1 of 3)]
[im 1/13]
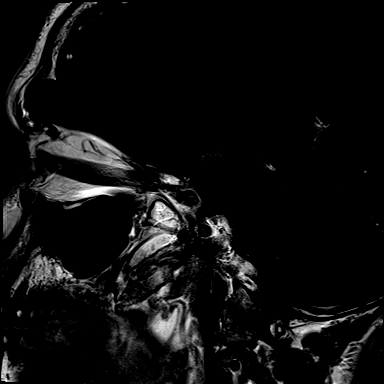

[Series 40: T1 post-contrast · coronal · 3.0mm · 0.42mm/px · 1 of 10 slices shown (2 of 3)]
[im 1/10]
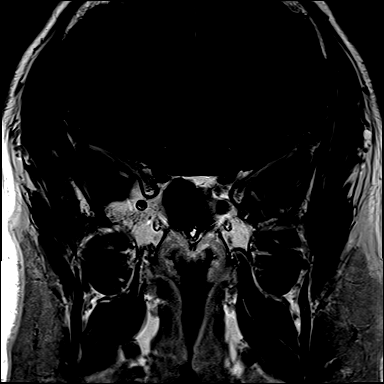

[Series 41: T1 · axial · 1.0mm · 0.86mm/px · z∈[-71,+71]mm · 5 of 144 slices shown (10 of 10)]
[im 1/144]
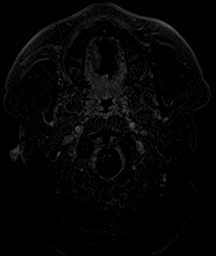
[im 36/144]
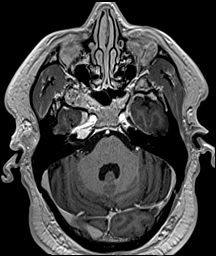
[im 72/144]
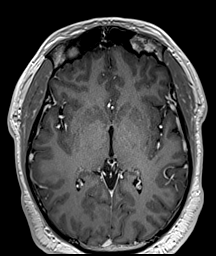
[im 108/144]
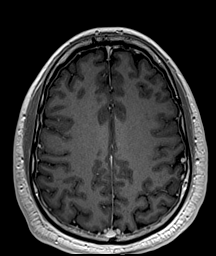
[im 144/144]
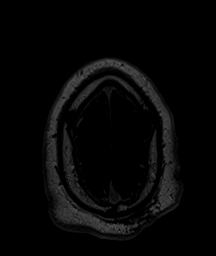

[Series 42: T1 post-contrast · coronal · 4.0mm · 0.47mm/px · 1 of 36 slices shown (3 of 3)]
[im 1/36]
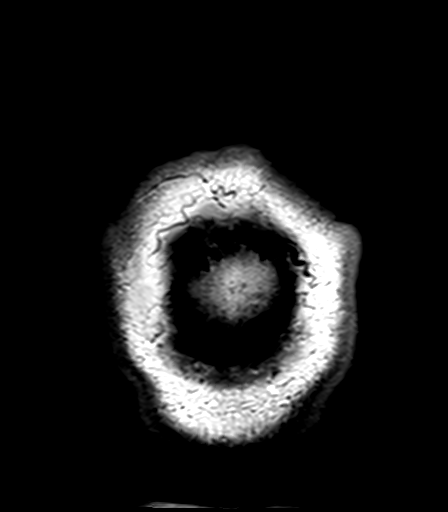

[48 of 48 positions shown; findings below may reference images not displayed]

FINDINGS: Brain: No acute infarction, hemorrhage, hydrocephalus, extra-axial
collection or mass lesion.

High-resolution imaging with dynamic contrast was performed through
the sella. There is relatively homogeneous enhancement of the
pituitary gland without evidence of a mass. The pituitary gland is
within normal limits for patient age. The infundibulum is midline
and normal in appearance. A T1 bright spot is not identified;
however, an ectopic posterior pituitary is not seen.

Vascular: Major arterial flow voids are maintained at the skull
base.

Skull and upper cervical spine: Normal marrow signal.

Sinuses/Orbits: The sinuses are clear.  Unremarkable orbits.

Other: No mastoid effusions.
IMPRESSION: 1. No evidence of acute intracranial abnormality.
2. No evidence of a pituitary mass.

## 2022-01-17 ENCOUNTER — Other Ambulatory Visit (HOSPITAL_COMMUNITY): Payer: Self-pay

## 2022-01-17 MED ORDER — ATORVASTATIN CALCIUM 20 MG PO TABS
20.0000 mg | ORAL_TABLET | Freq: Every day | ORAL | 3 refills | Status: DC
  Filled 2022-01-17: qty 90, 90d supply, fill #0
  Filled 2022-04-25 – 2022-04-27 (×2): qty 90, 90d supply, fill #1

## 2022-01-18 ENCOUNTER — Other Ambulatory Visit (HOSPITAL_COMMUNITY): Payer: Self-pay

## 2022-01-28 ENCOUNTER — Other Ambulatory Visit (HOSPITAL_COMMUNITY): Payer: Self-pay

## 2022-02-04 ENCOUNTER — Other Ambulatory Visit (HOSPITAL_COMMUNITY): Payer: Self-pay

## 2022-02-04 MED ORDER — AZITHROMYCIN 500 MG PO TABS
500.0000 mg | ORAL_TABLET | Freq: Every day | ORAL | 0 refills | Status: DC
  Filled 2022-02-04: qty 5, 5d supply, fill #0

## 2022-02-05 ENCOUNTER — Other Ambulatory Visit (HOSPITAL_COMMUNITY): Payer: Self-pay

## 2022-02-19 ENCOUNTER — Other Ambulatory Visit (HOSPITAL_COMMUNITY): Payer: Self-pay

## 2022-03-18 DIAGNOSIS — G4733 Obstructive sleep apnea (adult) (pediatric): Secondary | ICD-10-CM | POA: Diagnosis not present

## 2022-04-18 ENCOUNTER — Ambulatory Visit: Payer: 59 | Admitting: Pulmonary Disease

## 2022-04-18 ENCOUNTER — Encounter: Payer: Self-pay | Admitting: Pulmonary Disease

## 2022-04-18 VITALS — BP 118/82 | HR 97 | Ht 67.0 in | Wt 346.6 lb

## 2022-04-18 DIAGNOSIS — G4733 Obstructive sleep apnea (adult) (pediatric): Secondary | ICD-10-CM

## 2022-04-18 NOTE — Patient Instructions (Signed)
Your CPAP continues to work well  Continue using CPAP nightly  Try and get 6 to 8 hours of sleep every night  Call with significant concerns  Follow-up a year from now

## 2022-04-18 NOTE — Progress Notes (Signed)
Shawn Mckenzie    [id number]    [birth year]  Primary Care Physician:Polite, Jori Moll, MD  Referring Physician: Seward Carol, MD [address] [place],   [address]  Chief complaint:   History of sleep apnea  HPI:  Continues to be compliant with CPAP use No issues with CPAP use  Diagnosed with obstructive sleep apnea in 2014  Continues to use CPAP nightly with no concerns Good number of hours of sleep at night Waking up feeling restored Not waking up in the middle of the night Having a 47-year-old does affect his sleep level  Doing well from a severe sleep apnea perspective  No significant sleepiness during the day  No significant dryness of the mouth in the mornings  Underlying history of hypertension, hypercholesterolemia -Controlled  Dad has OSA, mom has snoring  Never smoker  Usual bedtime about 9 30-'10 30 15 '$ to 20 minutes to sleep onset 3-4 awakenings Final wake up time about 5:30 AM  Occupation: Physician  Outpatient Encounter Medications as of 04/18/2022  Medication Sig   atorvastatin (LIPITOR) 20 MG tablet Take 1 tablet (20 mg total) by mouth daily.   cabergoline (DOSTINEX) 0.5 MG tablet Take 0.5 tablets (0.25 mg total) by mouth 2 (two) times a week.   levocetirizine (XYZAL) 5 MG tablet Take 5 mg by mouth daily as needed (Seasonal).   losartan (COZAAR) 50 MG tablet Take 1 tablet (50 mg total) by mouth daily.   RABEprazole (ACIPHEX) 20 MG tablet Take 1 tablet (20 mg total) by mouth daily.   azithromycin (ZITHROMAX) 500 MG tablet Take 1 tablet (500 mg total) by mouth daily for 5 days   gabapentin (NEURONTIN) 100 MG capsule TAKE 3 CAPSULES (300 MG TOTAL) BY MOUTH AT BEDTIME. (Patient not taking: Reported on 05/02/2021)   Glycerin-Hypromellose-PEG 400 (DRY EYE RELIEF DROPS) 0.2-0.2-1 % SOLN Place 1 drop into both eyes daily as needed (dry eye).   metoprolol tartrate (LOPRESSOR) 100 MG tablet Take 1 tablet (100 mg total) by mouth once  for 1 dose. Take 1 tablet (100 mg total) two hours prior to CT scan.   RABEprazole (ACIPHEX) 20 MG tablet Take 1 tablet (20 mg total) by mouth 2 (two) times daily. (30 minutes before a meal)   No facility-administered encounter medications on file as of 04/18/2022.    Allergies as of 04/18/2022 - Review Complete 04/18/2022  Allergen Reaction Noted   Latex Hives 10/19/2021   Penicillins Anaphylaxis 02/16/2019   Fish allergy Nausea And Vomiting and Swelling 05/02/2021    Past Medical History:  Diagnosis Date   Hypercholesteremia    Hypertension    Muscle spasm    Seasonal allergies    Sleep apnea     Past Surgical History:  Procedure Laterality Date   BIOPSY  05/08/2021   Procedure: BIOPSY;  Surgeon: Wilford Corner, MD;  Location: [facility];  Service: Endoscopy;;  EGD and COLON   COLONOSCOPY WITH PROPOFOL Bilateral 05/08/2021   Procedure: COLONOSCOPY WITH PROPOFOL;  Surgeon: Wilford Corner, MD;  Location: [facility];  Service: Endoscopy;  Laterality: Bilateral;   ESOPHAGOGASTRODUODENOSCOPY (EGD) WITH PROPOFOL Bilateral 05/08/2021   Procedure: ESOPHAGOGASTRODUODENOSCOPY (EGD) WITH PROPOFOL;  Surgeon: Wilford Corner, MD;  Location: [facility];  Service: Endoscopy;  Laterality: Bilateral;   No past surgery     POLYPECTOMY  05/08/2021   Procedure: POLYPECTOMY;  Surgeon: Wilford Corner, MD;  Location: [facility];  Service: Endoscopy;;    Family History  Problem  Relation Age of Onset   Hypertension Mother    Hypercholesterolemia Mother    Colon cancer Father 51   Lung cancer Maternal Grandfather 12   Colon cancer Paternal Grandmother 20   Ovarian cancer Paternal Aunt 47   Colon cancer Paternal Uncle 34    Social History   Socioeconomic History   Marital status: Married    Spouse name: Not on file   Number of children: Not on file   Years of education: college   Highest education level: Professional school degree (e.g., MD, DDS, DVM, JD)  Occupational  History   Occupation: physician  Tobacco Use   Smoking status: Never   Smokeless tobacco: Never  Vaping Use   Vaping Use: Never used  Substance and Sexual Activity   Alcohol use: Yes    Comment: 1 glass of scotch 2-3 times per week   Drug use: Never   Sexual activity: Not on file  Other Topics Concern   Not on file  Social History Narrative   Caffeine use: 12oz mug, 4-5 times per week.   Ambidextrous.   Lives at home with wife.   His first child is due 11/23/19 (son).   Social Determinants of Health   Financial Resource Strain: Not on file  Food Insecurity: Not on file  Transportation Needs: Not on file  Physical Activity: Not on file  Stress: Not on file  Social Connections: Not on file  Intimate Partner Violence: Not on file    Review of Systems  Constitutional:  Negative for fatigue.  Respiratory:  Positive for apnea.   Psychiatric/Behavioral:  Positive for sleep disturbance.   All other systems reviewed and are negative.   Vitals:   04/18/22 1011  BP: 118/82  Pulse: 97  SpO2: 97%     Physical Exam Constitutional:      Appearance: He is obese.  HENT:     Nose: No congestion.  Neck:     Comments: Neck size 19 Cardiovascular:     Rate and Rhythm: Normal rate and regular rhythm.     Heart sounds: No murmur heard.    No friction rub.  Pulmonary:     Effort: Pulmonary effort is normal. No respiratory distress.     Breath sounds: Normal breath sounds. No stridor. No wheezing or rhonchi.  Musculoskeletal:     Cervical back: No rigidity or tenderness.  Neurological:     Mental Status: He is alert.  Psychiatric:        Mood and Affect: Mood normal.     Data Reviewed: Previous study not available to be reviewed  Recent study reviewed showing severe obstructive sleep apnea  Compliance data reviewed showing excellent compliance with CPAP AHI of 0.46 We will for just Assessment:  History of obstructive sleep apnea -Severe obstructive sleep apnea  adequately treated -Continue CPAP    Plan/Recommendations: Continue CPAP on a nightly basis  Continue weight loss efforts  Follow-up a year from now    Sherrilyn Rist MD [facility] 04/18/2022, 10:31 AM  CC: Seward Carol, MD

## 2022-04-27 ENCOUNTER — Other Ambulatory Visit (HOSPITAL_COMMUNITY): Payer: Self-pay

## 2022-05-30 DIAGNOSIS — K760 Fatty (change of) liver, not elsewhere classified: Secondary | ICD-10-CM | POA: Diagnosis not present

## 2022-05-30 DIAGNOSIS — E230 Hypopituitarism: Secondary | ICD-10-CM | POA: Diagnosis not present

## 2022-05-30 DIAGNOSIS — Z131 Encounter for screening for diabetes mellitus: Secondary | ICD-10-CM | POA: Diagnosis not present

## 2022-05-30 DIAGNOSIS — E221 Hyperprolactinemia: Secondary | ICD-10-CM | POA: Diagnosis not present

## 2022-06-17 ENCOUNTER — Other Ambulatory Visit (HOSPITAL_COMMUNITY): Payer: Self-pay

## 2022-06-19 ENCOUNTER — Other Ambulatory Visit: Payer: Self-pay

## 2022-06-21 ENCOUNTER — Other Ambulatory Visit (HOSPITAL_COMMUNITY): Payer: Self-pay

## 2022-06-24 ENCOUNTER — Other Ambulatory Visit (HOSPITAL_COMMUNITY): Payer: Self-pay

## 2022-06-24 MED ORDER — WEGOVY 0.25 MG/0.5ML ~~LOC~~ SOAJ
0.2500 mg | SUBCUTANEOUS | 0 refills | Status: DC
  Filled 2022-06-24 – 2022-07-06 (×4): qty 2, 28d supply, fill #0

## 2022-06-28 ENCOUNTER — Other Ambulatory Visit (HOSPITAL_COMMUNITY): Payer: Self-pay

## 2022-07-03 ENCOUNTER — Other Ambulatory Visit (HOSPITAL_COMMUNITY): Payer: Self-pay

## 2022-07-04 ENCOUNTER — Other Ambulatory Visit (HOSPITAL_COMMUNITY): Payer: Self-pay

## 2022-07-06 ENCOUNTER — Other Ambulatory Visit (HOSPITAL_COMMUNITY): Payer: Self-pay

## 2022-07-24 ENCOUNTER — Other Ambulatory Visit (HOSPITAL_COMMUNITY): Payer: Self-pay

## 2022-07-24 MED ORDER — WEGOVY 0.5 MG/0.5ML ~~LOC~~ SOAJ
0.5000 mg | SUBCUTANEOUS | 0 refills | Status: DC
  Filled 2022-07-24 – 2022-07-29 (×3): qty 2, 28d supply, fill #0

## 2022-07-27 ENCOUNTER — Other Ambulatory Visit (HOSPITAL_COMMUNITY): Payer: Self-pay

## 2022-07-29 ENCOUNTER — Other Ambulatory Visit: Payer: Self-pay

## 2022-07-29 ENCOUNTER — Other Ambulatory Visit (HOSPITAL_COMMUNITY): Payer: Self-pay

## 2022-08-20 ENCOUNTER — Other Ambulatory Visit (HOSPITAL_COMMUNITY): Payer: Self-pay

## 2022-08-20 MED ORDER — WEGOVY 1 MG/0.5ML ~~LOC~~ SOAJ
1.0000 mg | SUBCUTANEOUS | 0 refills | Status: DC
  Filled 2022-08-20: qty 2, 28d supply, fill #0

## 2022-08-28 ENCOUNTER — Other Ambulatory Visit (HOSPITAL_COMMUNITY): Payer: Self-pay

## 2022-09-17 ENCOUNTER — Other Ambulatory Visit (HOSPITAL_COMMUNITY): Payer: Self-pay

## 2022-09-17 MED ORDER — WEGOVY 1.7 MG/0.75ML ~~LOC~~ SOAJ
1.7000 mg | SUBCUTANEOUS | 0 refills | Status: DC
  Filled 2022-09-17 – 2022-09-21 (×2): qty 3, 28d supply, fill #0

## 2022-09-21 ENCOUNTER — Other Ambulatory Visit (HOSPITAL_COMMUNITY): Payer: Self-pay

## 2022-10-17 ENCOUNTER — Other Ambulatory Visit (HOSPITAL_COMMUNITY): Payer: Self-pay

## 2022-10-17 MED ORDER — WEGOVY 1.7 MG/0.75ML ~~LOC~~ SOAJ
1.7000 mg | SUBCUTANEOUS | 1 refills | Status: DC
  Filled 2022-10-17: qty 3, 28d supply, fill #0
  Filled 2022-11-16: qty 3, 28d supply, fill #1

## 2022-11-16 ENCOUNTER — Other Ambulatory Visit (HOSPITAL_COMMUNITY): Payer: Self-pay

## 2022-11-20 ENCOUNTER — Other Ambulatory Visit (HOSPITAL_COMMUNITY): Payer: Self-pay

## 2022-11-21 ENCOUNTER — Other Ambulatory Visit (HOSPITAL_COMMUNITY): Payer: Self-pay

## 2022-12-11 ENCOUNTER — Other Ambulatory Visit (HOSPITAL_COMMUNITY): Payer: Self-pay

## 2022-12-13 ENCOUNTER — Other Ambulatory Visit (HOSPITAL_COMMUNITY): Payer: Self-pay

## 2022-12-13 MED ORDER — CABERGOLINE 0.5 MG PO TABS
ORAL_TABLET | ORAL | 5 refills | Status: AC
  Filled 2022-12-13: qty 8, 28d supply, fill #0
  Filled 2023-01-11: qty 8, 28d supply, fill #1
  Filled 2023-04-01: qty 8, 28d supply, fill #2
  Filled 2023-05-05: qty 8, 28d supply, fill #3

## 2022-12-13 MED ORDER — WEGOVY 1.7 MG/0.75ML ~~LOC~~ SOAJ
1.7000 mg | SUBCUTANEOUS | 1 refills | Status: DC
  Filled 2022-12-13: qty 3, 28d supply, fill #0
  Filled 2023-01-11: qty 3, 28d supply, fill #1

## 2022-12-16 ENCOUNTER — Other Ambulatory Visit (HOSPITAL_COMMUNITY): Payer: Self-pay

## 2023-01-14 ENCOUNTER — Other Ambulatory Visit (HOSPITAL_COMMUNITY): Payer: Self-pay

## 2023-01-20 ENCOUNTER — Encounter: Payer: Self-pay | Admitting: Podiatry

## 2023-01-20 ENCOUNTER — Ambulatory Visit: Payer: 59 | Admitting: Podiatry

## 2023-01-20 DIAGNOSIS — M722 Plantar fascial fibromatosis: Secondary | ICD-10-CM | POA: Diagnosis not present

## 2023-01-20 NOTE — Progress Notes (Signed)
Subjective:   Patient ID: Shawn Mckenzie, male   DOB: 47 y.o.   MRN: [id number]   HPI Patient states I need new orthotics and I am losing weight at the current time   Review of Systems  All other systems reviewed and are negative.       Objective:  Physical Exam Vitals and nursing note reviewed.  Constitutional:      Appearance: He is well-developed.  Pulmonary:     Effort: Pulmonary effort is normal.  Musculoskeletal:        General: Normal range of motion.  Skin:    General: Skin is warm.  Neurological:     Mental Status: He is alert.     Neurovascular status intact pain in the heels which is receding with diminishment of the discomfort with orthotics which have lost some of their support mechanism     Assessment:  Acute plantar fasciitis bilateral that has improved with orthotics which have been of great benefit     Plan:  H&P reviewed and I have recommended that we continue conservative and patient is casted by pedorthist for new functional orthotic devices

## 2023-01-20 NOTE — Progress Notes (Signed)
Patient was seen, measured / scanned for custom molded foot orthotics post visit with Dr Charlsie Merles  Patient will benefit from CFO's as they will help provide total contact to MLA's helping to better distribute body weight across BIL feet greater reducing plantar pressure and pain and to also encourage FF and RF alignment.  Patient was scanned items to be ordered and fit when in  Dress orthotic ordered today / I re-glued current dress orthotics  Patient will also want refurbish to other pair he has but will wear for now until he receives new pair  Shawn Mckenzie Cped, CFo, CFm

## 2023-02-03 ENCOUNTER — Other Ambulatory Visit (HOSPITAL_COMMUNITY): Payer: Self-pay

## 2023-02-03 MED ORDER — LOSARTAN POTASSIUM 50 MG PO TABS
50.0000 mg | ORAL_TABLET | Freq: Every day | ORAL | 3 refills | Status: AC
  Filled 2023-02-03: qty 30, 30d supply, fill #0
  Filled 2023-03-03: qty 30, 30d supply, fill #1
  Filled 2024-01-08 (×2): qty 30, 30d supply, fill #2

## 2023-02-03 MED ORDER — ATORVASTATIN CALCIUM 20 MG PO TABS
20.0000 mg | ORAL_TABLET | Freq: Every day | ORAL | 3 refills | Status: AC
  Filled 2023-02-03: qty 30, 30d supply, fill #0
  Filled 2023-03-03: qty 30, 30d supply, fill #1
  Filled 2023-04-01: qty 30, 30d supply, fill #2
  Filled 2023-05-05: qty 30, 30d supply, fill #3
  Filled 2023-06-02: qty 30, 30d supply, fill #4
  Filled 2023-07-03: qty 30, 30d supply, fill #5
  Filled 2023-07-04: qty 30, 30d supply, fill #0
  Filled 2023-08-02: qty 30, 30d supply, fill #1
  Filled 2023-08-31: qty 30, 30d supply, fill #2

## 2023-02-03 MED ORDER — OMEPRAZOLE 20 MG PO CPDR
20.0000 mg | DELAYED_RELEASE_CAPSULE | Freq: Every morning | ORAL | 3 refills | Status: AC
  Filled 2023-02-03: qty 30, 30d supply, fill #0
  Filled 2023-03-03: qty 30, 30d supply, fill #1
  Filled 2023-04-01: qty 30, 30d supply, fill #2
  Filled 2023-05-05: qty 30, 30d supply, fill #3
  Filled 2023-06-02: qty 30, 30d supply, fill #4
  Filled 2023-07-03: qty 30, 30d supply, fill #5
  Filled 2023-07-04: qty 30, 30d supply, fill #0
  Filled 2023-08-02: qty 30, 30d supply, fill #1
  Filled 2023-08-31: qty 30, 30d supply, fill #2

## 2023-02-04 ENCOUNTER — Other Ambulatory Visit (HOSPITAL_COMMUNITY): Payer: Self-pay

## 2023-02-06 ENCOUNTER — Other Ambulatory Visit (HOSPITAL_COMMUNITY): Payer: Self-pay

## 2023-02-08 ENCOUNTER — Other Ambulatory Visit (HOSPITAL_COMMUNITY): Payer: Self-pay

## 2023-02-10 ENCOUNTER — Other Ambulatory Visit (HOSPITAL_COMMUNITY): Payer: Self-pay

## 2023-02-10 MED ORDER — WEGOVY 1.7 MG/0.75ML ~~LOC~~ SOAJ
1.7000 mg | SUBCUTANEOUS | 1 refills | Status: DC
  Filled 2023-02-10: qty 3, 28d supply, fill #0
  Filled 2023-03-08 – 2023-03-10 (×2): qty 3, 28d supply, fill #1

## 2023-02-13 ENCOUNTER — Other Ambulatory Visit (HOSPITAL_COMMUNITY): Payer: Self-pay

## 2023-02-25 ENCOUNTER — Other Ambulatory Visit (HOSPITAL_COMMUNITY): Payer: Self-pay

## 2023-02-25 ENCOUNTER — Encounter: Payer: Self-pay | Admitting: Podiatry

## 2023-03-10 ENCOUNTER — Other Ambulatory Visit (HOSPITAL_COMMUNITY): Payer: Self-pay

## 2023-03-26 ENCOUNTER — Ambulatory Visit: Payer: 59

## 2023-03-26 DIAGNOSIS — M79673 Pain in unspecified foot: Secondary | ICD-10-CM

## 2023-03-26 DIAGNOSIS — M722 Plantar fascial fibromatosis: Secondary | ICD-10-CM | POA: Diagnosis not present

## 2023-03-26 NOTE — Progress Notes (Signed)
Patient presents today to pick up custom molded foot orthotics, diagnosed with PF by Dr. Charlsie Merles.   Orthotics were dispensed and fit was satisfactory. Reviewed instructions for break-in and wear. Written instructions given to patient.  Patient will follow up as needed. Old pair orthotics sent out today for refurbish patient will owe  $90 Shawn Mckenzie CPed CFo CFm

## 2023-04-01 ENCOUNTER — Other Ambulatory Visit (HOSPITAL_COMMUNITY): Payer: Self-pay

## 2023-04-03 ENCOUNTER — Other Ambulatory Visit (HOSPITAL_COMMUNITY): Payer: Self-pay

## 2023-04-03 ENCOUNTER — Other Ambulatory Visit: Payer: Self-pay

## 2023-04-03 MED ORDER — WEGOVY 1.7 MG/0.75ML ~~LOC~~ SOAJ
1.7000 mg | SUBCUTANEOUS | 1 refills | Status: DC
  Filled 2023-04-03: qty 3, 28d supply, fill #0
  Filled 2023-04-12 – 2023-04-25 (×2): qty 3, 28d supply, fill #1

## 2023-04-12 ENCOUNTER — Other Ambulatory Visit (HOSPITAL_COMMUNITY): Payer: Self-pay

## 2023-04-12 ENCOUNTER — Other Ambulatory Visit (HOSPITAL_BASED_OUTPATIENT_CLINIC_OR_DEPARTMENT_OTHER): Payer: Self-pay

## 2023-04-25 ENCOUNTER — Other Ambulatory Visit (HOSPITAL_COMMUNITY): Payer: Self-pay

## 2023-05-05 ENCOUNTER — Other Ambulatory Visit (HOSPITAL_COMMUNITY): Payer: Self-pay

## 2023-05-14 ENCOUNTER — Ambulatory Visit (INDEPENDENT_AMBULATORY_CARE_PROVIDER_SITE_OTHER): Payer: 59 | Admitting: Pulmonary Disease

## 2023-05-14 VITALS — BP 114/88 | HR 76 | Temp 98.4°F | Ht 67.0 in | Wt 282.0 lb

## 2023-05-14 DIAGNOSIS — G4733 Obstructive sleep apnea (adult) (pediatric): Secondary | ICD-10-CM | POA: Diagnosis not present

## 2023-05-14 NOTE — Patient Instructions (Signed)
 I will see you a year from now  Call us  with significant concerns  Continue CPAP nightly, download shows it is working well

## 2023-05-14 NOTE — Progress Notes (Signed)
 Shawn Mckenzie    [id number]    1976-01-26  Primary Care Physician:Polite, Tanda, MD  Referring Physician: Rexanne Tanda, MD [address] [place],  [place] [address]  Chief complaint:   History of sleep apnea  HPI:  Continues to tolerate CPAP well  Not having any issues with CPAP  Sometimes wakes up feeling like the pressure is high, will reset the machine and usually will go back to sleep  Wakes up feeling restored  Has lost over 50 pounds since her last visit  Doing well from a severe sleep apnea perspective  No significant sleepiness during the day  No significant dryness of the mouth in the mornings  Underlying history of hypertension, hypercholesterolemia -Controlled  Dad has OSA, mom has snoring  Never smoker  Usual bedtime about 9 30-10 30 15  to 20 minutes to sleep onset 3-4 awakenings Final wake up time about 5:30 AM  Occupation: Physician  Outpatient Encounter Medications as of 05/14/2023  Medication Sig   atorvastatin  (LIPITOR) 20 MG tablet Take 1 tablet (20 mg total) by mouth daily.   cabergoline  (DOSTINEX ) 0.5 MG tablet TAKE 1/2 TABLET BY MOUTH 2 TIMES A WEEK   losartan  (COZAAR ) 50 MG tablet Take 1 tablet (50 mg total) by mouth daily.   omeprazole  (PRILOSEC) 20 MG capsule Take 1 capsule (20 mg total) by mouth in the morning 1/2 - 1 hour before morning meal   Semaglutide -Weight Management (WEGOVY ) 1.7 MG/0.75ML SOAJ Inject 1.7 mg into the skin once a week.   [DISCONTINUED] atorvastatin  (LIPITOR) 20 MG tablet Take 1 tablet (20 mg total) by mouth daily.   [DISCONTINUED] cabergoline  (DOSTINEX ) 0.5 MG tablet Take 0.5 tablets (0.25 mg total) by mouth 2 (two) times a week.   [DISCONTINUED] gabapentin  (NEURONTIN ) 100 MG capsule TAKE 3 CAPSULES (300 MG TOTAL) BY MOUTH AT BEDTIME. (Patient not taking: Reported on 05/02/2021)   [DISCONTINUED] levocetirizine (XYZAL) 5 MG tablet Take 5 mg by mouth daily as needed (Seasonal).    [DISCONTINUED] losartan  (COZAAR ) 50 MG tablet Take 1 tablet (50 mg total) by mouth daily.   [DISCONTINUED] metoprolol  tartrate (LOPRESSOR ) 100 MG tablet Take 1 tablet (100 mg total) by mouth once for 1 dose. Take 1 tablet (100 mg total) two hours prior to CT scan.   [DISCONTINUED] RABEprazole  (ACIPHEX ) 20 MG tablet Take 1 tablet (20 mg total) by mouth 2 (two) times daily. (30 minutes before a meal)   [DISCONTINUED] RABEprazole  (ACIPHEX ) 20 MG tablet Take 1 tablet (20 mg total) by mouth daily.   [DISCONTINUED] Semaglutide -Weight Management (WEGOVY ) 0.25 MG/0.5ML SOAJ Inject 0.25 mg into the skin once a week.   [DISCONTINUED] Semaglutide -Weight Management (WEGOVY ) 0.5 MG/0.5ML SOAJ Inject 0.5 mg into the skin once a week.   [DISCONTINUED] Semaglutide -Weight Management (WEGOVY ) 1 MG/0.5ML SOAJ Inject 1 mg into the skin once a week.   No facility-administered encounter medications on file as of 05/14/2023.    Allergies as of 05/14/2023 - Review Complete 05/14/2023  Allergen Reaction Noted   Latex Hives 10/19/2021   Penicillins Anaphylaxis 02/16/2019   Fish allergy Nausea And Vomiting and Swelling 05/02/2021    Past Medical History:  Diagnosis Date   Hypercholesteremia    Hypertension    Muscle spasm    Seasonal allergies    Sleep apnea     Past Surgical History:  Procedure Laterality Date   BIOPSY  05/08/2021   Procedure: BIOPSY;  Surgeon: Dianna Specking, MD;  Location: [facility];  Service: Endoscopy;;  EGD and COLON   COLONOSCOPY WITH PROPOFOL  Bilateral 05/08/2021   Procedure: COLONOSCOPY WITH PROPOFOL ;  Surgeon: Dianna Specking, MD;  Location: [facility];  Service: Endoscopy;  Laterality: Bilateral;   ESOPHAGOGASTRODUODENOSCOPY (EGD) WITH PROPOFOL  Bilateral 05/08/2021   Procedure: ESOPHAGOGASTRODUODENOSCOPY (EGD) WITH PROPOFOL ;  Surgeon: Dianna Specking, MD;  Location: [facility];  Service: Endoscopy;  Laterality: Bilateral;   No past surgery     POLYPECTOMY  05/08/2021    Procedure: POLYPECTOMY;  Surgeon: Dianna Specking, MD;  Location: [facility];  Service: Endoscopy;;    Family History  Problem Relation Age of Onset   Hypertension Mother    Hypercholesterolemia Mother    Colon cancer Father 35   Lung cancer Maternal Grandfather 10   Colon cancer Paternal Grandmother 25   Ovarian cancer Paternal Aunt 35   Colon cancer Paternal Uncle 22    Social History   Socioeconomic History   Marital status: Married    Spouse name: Not on file   Number of children: Not on file   Years of education: college   Highest education level: Professional school degree (e.g., MD, DDS, DVM, JD)  Occupational History   Occupation: physician  Tobacco Use   Smoking status: Never   Smokeless tobacco: Never  Vaping Use   Vaping status: Never Used  Substance and Sexual Activity   Alcohol use: Yes    Comment: 1 glass of scotch 2-3 times per week   Drug use: Never   Sexual activity: Not on file  Other Topics Concern   Not on file  Social History Narrative   Caffeine use: 12oz mug, 4-5 times per week.   Ambidextrous.   Lives at home with wife.   His first child is due 11/23/19 (son).   Social Drivers of Corporate Investment Banker Strain: Not on file  Food Insecurity: Not on file  Transportation Needs: Not on file  Physical Activity: Not on file  Stress: Not on file  Social Connections: Not on file  Intimate Partner Violence: Not on file    Review of Systems  Constitutional:  Negative for fatigue.  Respiratory:  Positive for apnea.   Psychiatric/Behavioral:  Positive for sleep disturbance.   All other systems reviewed and are negative.   Vitals:   05/14/23 1556  BP: 114/88  Pulse: 76  Temp: 98.4 F (36.9 C)  SpO2: 98%     Physical Exam Constitutional:      Appearance: He is obese.  HENT:     Nose: No congestion.  Cardiovascular:     Rate and Rhythm: Normal rate and regular rhythm.     Heart sounds: No murmur heard.    No friction rub.   Pulmonary:     Effort: Pulmonary effort is normal. No respiratory distress.     Breath sounds: Normal breath sounds. No stridor. No wheezing or rhonchi.  Musculoskeletal:     Cervical back: No rigidity or tenderness.  Neurological:     Mental Status: He is alert.  Psychiatric:        Mood and Affect: Mood normal.     Data Reviewed: Compliance data reviewed showing excellent compliance with CPAP Auto CPAP 5-20 with residual AHI of 0.31  Assessment:  Severe obstructive sleep apnea -Adequately treated with CPAP therapy -Continues to benefit from CPAP  Class III obesity with good weight loss recently  Plan/Recommendations: Continue CPAP on a nightly basis  Continue weight loss efforts  Follow-up a year from now  Jennet Epley MD [facility] 05/14/2023, 4:48 PM  CC: Rexanne Ingle, MD

## 2023-05-26 ENCOUNTER — Other Ambulatory Visit (HOSPITAL_COMMUNITY): Payer: Self-pay

## 2023-05-26 MED ORDER — WEGOVY 1.7 MG/0.75ML ~~LOC~~ SOAJ
1.7000 mg | SUBCUTANEOUS | 11 refills | Status: AC
Start: 2023-05-26 — End: ?
  Filled 2023-05-26: qty 3, 28d supply, fill #0

## 2023-05-26 MED ORDER — WEGOVY 1.7 MG/0.75ML ~~LOC~~ SOAJ
1.7000 mg | SUBCUTANEOUS | 12 refills | Status: AC
  Filled 2023-05-26: qty 3, 28d supply, fill #0
  Filled 2023-06-22: qty 3, 28d supply, fill #1
  Filled 2023-07-19: qty 3, 28d supply, fill #2
  Filled 2023-08-17: qty 3, 28d supply, fill #3
  Filled 2023-09-13: qty 3, 28d supply, fill #4
  Filled 2023-10-11: qty 3, 28d supply, fill #5
  Filled 2023-12-07: qty 3, 28d supply, fill #6
  Filled 2024-01-08 (×2): qty 3, 28d supply, fill #7
  Filled 2024-02-04: qty 3, 28d supply, fill #8
  Filled 2024-03-03: qty 3, 28d supply, fill #9
  Filled 2024-03-29: qty 3, 28d supply, fill #10
  Filled 2024-04-30: qty 3, 28d supply, fill #11

## 2023-05-30 ENCOUNTER — Other Ambulatory Visit (HOSPITAL_COMMUNITY): Payer: Self-pay

## 2023-07-04 ENCOUNTER — Other Ambulatory Visit (HOSPITAL_COMMUNITY): Payer: Self-pay

## 2023-07-10 ENCOUNTER — Other Ambulatory Visit (HOSPITAL_COMMUNITY): Payer: Self-pay

## 2023-08-04 ENCOUNTER — Other Ambulatory Visit (HOSPITAL_COMMUNITY): Payer: Self-pay

## 2023-08-04 ENCOUNTER — Encounter: Payer: Self-pay | Admitting: Pharmacist

## 2023-08-04 ENCOUNTER — Other Ambulatory Visit: Payer: Self-pay

## 2023-09-25 ENCOUNTER — Other Ambulatory Visit (HOSPITAL_BASED_OUTPATIENT_CLINIC_OR_DEPARTMENT_OTHER): Payer: Self-pay

## 2023-09-25 ENCOUNTER — Other Ambulatory Visit: Payer: Self-pay

## 2023-09-25 MED ORDER — LOSARTAN POTASSIUM 50 MG PO TABS
50.0000 mg | ORAL_TABLET | Freq: Every day | ORAL | 3 refills | Status: AC
  Filled 2023-09-25: qty 30, 30d supply, fill #0
  Filled 2023-12-03: qty 30, 30d supply, fill #1
  Filled 2024-02-04: qty 30, 30d supply, fill #2
  Filled 2024-03-03: qty 30, 30d supply, fill #3
  Filled 2024-04-06: qty 30, 30d supply, fill #4
  Filled 2024-05-02: qty 30, 30d supply, fill #5

## 2023-09-25 MED ORDER — CABERGOLINE 0.5 MG PO TABS
0.2500 mg | ORAL_TABLET | ORAL | 4 refills | Status: AC
  Filled 2023-09-25: qty 8, 30d supply, fill #0
  Filled 2023-12-03: qty 8, 30d supply, fill #1
  Filled 2024-01-08: qty 8, 30d supply, fill #2
  Filled 2024-02-09: qty 8, 30d supply, fill #3

## 2023-09-25 MED ORDER — WEGOVY 1.7 MG/0.75ML ~~LOC~~ SOAJ
1.7000 mg | SUBCUTANEOUS | 3 refills | Status: AC
  Filled 2023-09-25 – 2023-11-11 (×2): qty 3, 28d supply, fill #0

## 2023-09-25 MED ORDER — ATORVASTATIN CALCIUM 20 MG PO TABS
20.0000 mg | ORAL_TABLET | Freq: Every day | ORAL | 3 refills | Status: AC
  Filled 2023-09-25: qty 30, 30d supply, fill #0
  Filled 2023-11-03: qty 30, 30d supply, fill #1
  Filled 2023-12-07: qty 30, 30d supply, fill #2
  Filled 2024-01-08: qty 30, 30d supply, fill #3
  Filled 2024-02-09: qty 30, 30d supply, fill #4
  Filled 2024-03-10: qty 30, 30d supply, fill #5
  Filled 2024-04-06: qty 30, 30d supply, fill #6
  Filled 2024-05-05: qty 30, 30d supply, fill #7

## 2023-10-01 ENCOUNTER — Other Ambulatory Visit (HOSPITAL_BASED_OUTPATIENT_CLINIC_OR_DEPARTMENT_OTHER): Payer: Self-pay

## 2023-10-01 MED ORDER — OMEPRAZOLE 20 MG PO TBEC
20.0000 mg | DELAYED_RELEASE_TABLET | Freq: Every morning | ORAL | 3 refills | Status: AC
Start: 2023-10-01 — End: ?
  Filled 2023-10-01: qty 28, 28d supply, fill #0

## 2023-10-01 MED ORDER — OMEPRAZOLE 20 MG PO TBDD: 20.0000 mg | DELAYED_RELEASE_TABLET | Freq: Every day | ORAL | 3 refills | Status: AC

## 2023-10-06 ENCOUNTER — Encounter (HOSPITAL_BASED_OUTPATIENT_CLINIC_OR_DEPARTMENT_OTHER): Payer: Self-pay

## 2023-10-07 ENCOUNTER — Other Ambulatory Visit (HOSPITAL_BASED_OUTPATIENT_CLINIC_OR_DEPARTMENT_OTHER): Payer: Self-pay

## 2023-10-08 ENCOUNTER — Other Ambulatory Visit (HOSPITAL_BASED_OUTPATIENT_CLINIC_OR_DEPARTMENT_OTHER): Payer: Self-pay

## 2023-10-13 ENCOUNTER — Other Ambulatory Visit (HOSPITAL_BASED_OUTPATIENT_CLINIC_OR_DEPARTMENT_OTHER): Payer: Self-pay

## 2023-11-07 ENCOUNTER — Other Ambulatory Visit (HOSPITAL_BASED_OUTPATIENT_CLINIC_OR_DEPARTMENT_OTHER): Payer: Self-pay

## 2023-11-07 MED ORDER — AZITHROMYCIN 250 MG PO TABS
ORAL_TABLET | ORAL | 0 refills | Status: AC
Start: 2023-11-07 — End: ?
  Filled 2023-11-07: qty 6, 5d supply, fill #0

## 2023-11-11 ENCOUNTER — Other Ambulatory Visit (HOSPITAL_BASED_OUTPATIENT_CLINIC_OR_DEPARTMENT_OTHER): Payer: Self-pay

## 2023-11-12 ENCOUNTER — Other Ambulatory Visit (HOSPITAL_BASED_OUTPATIENT_CLINIC_OR_DEPARTMENT_OTHER): Payer: Self-pay

## 2023-12-10 ENCOUNTER — Other Ambulatory Visit (HOSPITAL_BASED_OUTPATIENT_CLINIC_OR_DEPARTMENT_OTHER): Payer: Self-pay

## 2023-12-10 ENCOUNTER — Other Ambulatory Visit (HOSPITAL_COMMUNITY): Payer: Self-pay

## 2023-12-11 ENCOUNTER — Other Ambulatory Visit (HOSPITAL_BASED_OUTPATIENT_CLINIC_OR_DEPARTMENT_OTHER): Payer: Self-pay

## 2023-12-12 ENCOUNTER — Other Ambulatory Visit (HOSPITAL_BASED_OUTPATIENT_CLINIC_OR_DEPARTMENT_OTHER): Payer: Self-pay

## 2023-12-15 ENCOUNTER — Other Ambulatory Visit (HOSPITAL_BASED_OUTPATIENT_CLINIC_OR_DEPARTMENT_OTHER): Payer: Self-pay

## 2024-01-01 ENCOUNTER — Other Ambulatory Visit (HOSPITAL_COMMUNITY): Payer: Self-pay

## 2024-01-08 ENCOUNTER — Other Ambulatory Visit (HOSPITAL_BASED_OUTPATIENT_CLINIC_OR_DEPARTMENT_OTHER): Payer: Self-pay

## 2024-02-05 ENCOUNTER — Other Ambulatory Visit (HOSPITAL_BASED_OUTPATIENT_CLINIC_OR_DEPARTMENT_OTHER): Payer: Self-pay

## 2024-05-01 ENCOUNTER — Other Ambulatory Visit (HOSPITAL_BASED_OUTPATIENT_CLINIC_OR_DEPARTMENT_OTHER): Payer: Self-pay

## 2024-05-04 ENCOUNTER — Other Ambulatory Visit (HOSPITAL_BASED_OUTPATIENT_CLINIC_OR_DEPARTMENT_OTHER): Payer: Self-pay

## 2024-05-06 ENCOUNTER — Other Ambulatory Visit: Payer: Self-pay

## 2024-05-06 ENCOUNTER — Other Ambulatory Visit (HOSPITAL_COMMUNITY): Payer: Self-pay

## 2024-05-13 ENCOUNTER — Other Ambulatory Visit (HOSPITAL_BASED_OUTPATIENT_CLINIC_OR_DEPARTMENT_OTHER): Payer: Self-pay

## 2024-05-13 MED ORDER — TADALAFIL 5 MG PO TABS
5.0000 mg | ORAL_TABLET | Freq: Every day | ORAL | 11 refills | Status: AC | PRN
  Filled 2024-05-13: qty 30, 30d supply, fill #0

## 2024-07-14 ENCOUNTER — Ambulatory Visit: Admitting: Pulmonary Disease
